# Patient Record
Sex: Male | Born: 1985
Health system: Southern US, Community
[De-identification: ages and names within clinical notes are randomized; demographics above are authoritative.]

## PROBLEM LIST (undated history)

## (undated) DIAGNOSIS — J45909 Unspecified asthma, uncomplicated: Secondary | ICD-10-CM

## (undated) DIAGNOSIS — J342 Deviated nasal septum: Secondary | ICD-10-CM

## (undated) DIAGNOSIS — J343 Hypertrophy of nasal turbinates: Secondary | ICD-10-CM

## (undated) DIAGNOSIS — Z87820 Personal history of traumatic brain injury: Secondary | ICD-10-CM

## (undated) HISTORY — DX: Unspecified asthma, uncomplicated: J45.909

## (undated) HISTORY — PX: NEVUS EXCISION: SHX2090

## (undated) HISTORY — PX: CYST EXCISION: SHX5701

---
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
## 2005-12-13 ENCOUNTER — Ambulatory Visit: Payer: Self-pay | Admitting: Family Medicine

## 2006-02-07 ENCOUNTER — Ambulatory Visit: Payer: Self-pay | Admitting: Family Medicine

## 2006-11-30 ENCOUNTER — Ambulatory Visit: Payer: Self-pay | Admitting: Family Medicine

## 2007-01-29 ENCOUNTER — Ambulatory Visit: Payer: Self-pay | Admitting: Family Medicine

## 2007-06-27 ENCOUNTER — Ambulatory Visit: Payer: Self-pay | Admitting: Family Medicine

## 2007-10-23 ENCOUNTER — Ambulatory Visit: Payer: Self-pay | Admitting: Family Medicine

## 2008-01-18 ENCOUNTER — Ambulatory Visit: Payer: Self-pay | Admitting: Family Medicine

## 2008-07-25 ENCOUNTER — Ambulatory Visit: Payer: Self-pay | Admitting: Family Medicine

## 2009-02-19 ENCOUNTER — Ambulatory Visit: Payer: Self-pay | Admitting: Family Medicine

## 2009-04-09 ENCOUNTER — Ambulatory Visit: Payer: Self-pay | Admitting: Family Medicine

## 2010-01-21 ENCOUNTER — Ambulatory Visit: Payer: Self-pay | Admitting: Family Medicine

## 2011-01-27 ENCOUNTER — Telehealth: Payer: Self-pay | Admitting: Family Medicine

## 2011-01-27 NOTE — Telephone Encounter (Signed)
Patient was notified that his medication was called in for 30 days but he will need an ov in order to receive anymore refills. CLS

## 2011-01-28 ENCOUNTER — Ambulatory Visit (INDEPENDENT_AMBULATORY_CARE_PROVIDER_SITE_OTHER): Payer: Managed Care, Other (non HMO) | Admitting: Family Medicine

## 2011-01-28 ENCOUNTER — Encounter: Payer: Self-pay | Admitting: Family Medicine

## 2011-01-28 ENCOUNTER — Other Ambulatory Visit (INDEPENDENT_AMBULATORY_CARE_PROVIDER_SITE_OTHER): Payer: Managed Care, Other (non HMO)

## 2011-01-28 VITALS — BP 130/88 | HR 88 | Wt 165.0 lb

## 2011-01-28 DIAGNOSIS — Z23 Encounter for immunization: Secondary | ICD-10-CM

## 2011-01-28 DIAGNOSIS — J301 Allergic rhinitis due to pollen: Secondary | ICD-10-CM

## 2011-01-28 DIAGNOSIS — J45909 Unspecified asthma, uncomplicated: Secondary | ICD-10-CM

## 2011-01-28 MED ORDER — ZAFIRLUKAST 20 MG PO TABS: 20.0000 mg | ORAL_TABLET | Freq: Two times a day (BID) | ORAL | Status: DC

## 2011-01-28 NOTE — Progress Notes (Signed)
  Subjective:    Patient ID: [name], male    DOB: [birth year], 25 y.o.   MRN: [id number]  HPI He is here for a recheck. He does have underlying allergies and is on medications listed in the chart. He is doing quite well on them. His asthma gives him very little difficulty. He is not taking Advair on a regular basis and does not need to.   Review of Systems     Objective:   Physical Exam alert and in no distress. Tympanic membranes and canals are normal. Throat is clear. Tonsils are normal. Neck is supple without adenopathy or thyromegaly. Cardiac exam shows a regular sinus rhythm without murmurs or gallops. Lungs are clear to auscultation.        Assessment & Plan:   1. Allergic rhinitis due to pollen   2. Asthma    discussed switching him to Singulair however we'll continue on Accolate. He is to get his own insurance in the near future and we might discuss switching him at that point.

## 2011-09-19 ENCOUNTER — Telehealth: Payer: Self-pay | Admitting: Family Medicine

## 2011-09-19 MED ORDER — FLUTICASONE PROPIONATE 50 MCG/ACT NA SUSP: 2.0000 | Freq: Every day | NASAL | Status: DC

## 2011-09-19 MED ORDER — FLUTICASONE-SALMETEROL 250-50 MCG/DOSE IN AEPB: 2.0000 | INHALATION_SPRAY | Freq: Two times a day (BID) | RESPIRATORY_TRACT | Status: DC

## 2011-09-19 NOTE — Telephone Encounter (Signed)
His medications were renewed for several months.

## 2011-09-29 ENCOUNTER — Emergency Department (HOSPITAL_COMMUNITY)
Admission: EM | Admit: 2011-09-29 | Discharge: 2011-09-30 | Disposition: A | Discharge status: Home / Self Care | Payer: BC Managed Care – PPO | Attending: Emergency Medicine | Admitting: Emergency Medicine

## 2011-09-29 ENCOUNTER — Emergency Department (HOSPITAL_COMMUNITY): Payer: BC Managed Care – PPO

## 2011-09-29 ENCOUNTER — Encounter (HOSPITAL_COMMUNITY): Payer: Self-pay | Admitting: Emergency Medicine

## 2011-09-29 DIAGNOSIS — IMO0002 Reserved for concepts with insufficient information to code with codable children: Secondary | ICD-10-CM | POA: Insufficient documentation

## 2011-09-29 DIAGNOSIS — W268XXA Contact with other sharp object(s), not elsewhere classified, initial encounter: Secondary | ICD-10-CM | POA: Insufficient documentation

## 2011-09-29 DIAGNOSIS — S61409A Unspecified open wound of unspecified hand, initial encounter: Secondary | ICD-10-CM | POA: Insufficient documentation

## 2011-09-29 DIAGNOSIS — Z87891 Personal history of nicotine dependence: Secondary | ICD-10-CM | POA: Insufficient documentation

## 2011-09-29 NOTE — ED Notes (Signed)
Alert no distress 

## 2011-09-29 NOTE — ED Notes (Signed)
PT. PUNCHED A REAR VIEW MIRROR OF A TRUCK THIS EVENING , PRESENTS WITH LACERATION AT RIGHT HAND KNUCKLE , PRESSURE DRESSING APPLIED AT TRIAGE , + ETOH.

## 2011-09-30 MED ORDER — OXYCODONE-ACETAMINOPHEN 5-325 MG PO TABS: ORAL_TABLET | ORAL | Status: AC

## 2011-09-30 MED ORDER — OXYCODONE-ACETAMINOPHEN 5-325 MG PO TABS
1.0000 | ORAL_TABLET | Freq: Once | ORAL | Status: AC
  Administered 2011-09-30: 1 via ORAL
  Filled 2011-09-30: qty 1

## 2011-09-30 NOTE — ED Provider Notes (Signed)
Medical screening examination/treatment/procedure(s) were performed by non-physician practitioner and as supervising physician I was immediately available for consultation/collaboration.  Ethelda Chick, MD 09/30/11 [phone]

## 2011-09-30 NOTE — ED Notes (Signed)
Snack tray given, patients wound cleaned and wrapped. Repeated Vitals

## 2011-09-30 NOTE — Discharge Instructions (Signed)
Follow up with your doctor, an urgent care, or this Emergency Department for removal of your stitches in 7 days. Do not submerge the stitches in water for the first 24 hours. Take your pain medication as prescribed. Do not operate heavy machinery while on pain medication. Note that your pain medication contains acetaminophen (Tylenol) & its is not reccommended that you use additional acetaminophen (Tylenol) while taking this medication. Read instructions below.  TREATMENT   Keep the wound clean and dry.   If you were given a bandage (dressing), you should change it at least once a day. Also, change the dressing if it becomes wet or dirty, or as directed by your caregiver.   Wash the wound with soap and water 2 times a day. Rinse the wound off with water to remove all soap. Pat the wound dry with a clean towel.   You may shower as usual after the first 24 hours. Do not soak the wound in water until the sutures are removed.   Once the wound has healed, scarring can be minimized by covering the wound with sunscreen during the day for 1 full year.Marland Kitchen   SEEK MEDICAL CARE IF:   You have redness, swelling, or increasing pain in the wound.   You see a red line that goes away from the wound.   You have yellowish-white fluid (pus) coming from the wound.   You have a fever.   You notice a bad smell coming from the wound or dressing.   Your wound breaks open before or after sutures have been removed.   You notice something coming out of the wound such as wood or glass.   Your wound is on your hand or foot and you cannot move a finger or toe.   Your pain is not controlled with prescribed medicine.   If you did not receive a tetanus shot today because you thought you were up to date, but did not recall when your last one was given, make sure to check with your primary caregiver to determine if you need one.

## 2011-09-30 NOTE — ED Provider Notes (Signed)
History     CSN: [id number]  Arrival date & time 09/29/11  [id number]   First MD Initiated Contact with Patient 09/30/11 0030      Chief Complaint  Patient presents with  . Laceration    (Consider location/radiation/quality/duration/timing/severity/associated sxs/prior treatment) HPI Comments: Patient presents emergency chief complaint of laceration on right knuckle.  Mechanism was punching mirror.  Patient denies any numbness, weakness or tingling of extremity.  Wound is currently bleeding.  Patient states his tetanus status is up to date and he received his last booster less than 5 years ago.  Laceration location is right hand knuckle dorsal surface of index finger.  No other complaints this time.  Patient is a 26 y.o. male presenting with skin laceration. The history is provided by the patient.  Laceration     History reviewed. No pertinent past medical history.  History reviewed. No pertinent past surgical history.  No family history on file.  History  Substance Use Topics  . Smoking status: Former Smoker    Quit date: 04/11/2006  . Smokeless tobacco: Not on file  . Alcohol Use: Yes      Review of Systems  Constitutional: Negative for fever, chills and appetite change.  HENT: Negative for congestion.   Eyes: Negative for visual disturbance.  Respiratory: Negative for shortness of breath.   Cardiovascular: Negative for chest pain and leg swelling.  Gastrointestinal: Negative for abdominal pain.  Genitourinary: Negative for dysuria, urgency and frequency.  Skin: Positive for wound.  Neurological: Negative for dizziness, syncope, weakness, light-headedness, numbness and headaches.  Psychiatric/Behavioral: Negative for confusion.  All other systems reviewed and are negative.    Allergies  Peanuts  Home Medications   Current Outpatient Rx  Name Route Sig Dispense Refill  . FLUTICASONE PROPIONATE 50 MCG/ACT NA SUSP Nasal Place 2 sprays into the nose daily. 16 g 3    . FLUTICASONE-SALMETEROL 250-50 MCG/DOSE IN AEPB Inhalation Inhale 2 puffs into the lungs every 12 (twelve) hours. 60 each 3  . ZAFIRLUKAST 20 MG PO TABS Oral Take 1 tablet (20 mg total) by mouth 2 (two) times daily. 180 tablet 3    BP 137/81  Pulse 110  Temp 97.6 F (36.4 C) (Oral)  Resp 16  SpO2 96%  Physical Exam  Nursing note and vitals reviewed. Constitutional: He is oriented to person, place, and time. He appears well-developed and well-nourished. No distress.  HENT:  Head: Normocephalic and atraumatic.  Eyes: Conjunctivae and EOM are normal.  Neck: Normal range of motion.  Cardiovascular:       Intact radial pulses  Pulmonary/Chest: Effort normal.  Musculoskeletal: Normal range of motion.       Flexion & extension of digits intatct  Neurological: He is alert and oriented to person, place, and time.       2 pt discrimination & sensation intact  Skin: Skin is warm and dry. No rash noted. He is not diaphoretic.       Superficial laceration over 2nd digit mcp dorsal surface, 3 cm round, currently bleeding.   Psychiatric: He has a normal mood and affect. His behavior is normal.    ED Course  Procedures (including critical care time)  Labs Reviewed - No data to display Dg Hand Complete Right  09/29/2011  *RADIOLOGY REPORT*  Clinical Data: Right hand pain and laceration secondary to blunt trauma.  RIGHT HAND - COMPLETE 3+ VIEW  Comparison: None.  Findings: There is no fracture or dislocation or other significant osseous abnormality.  No radiodense foreign bodies in the soft tissues.  Soft tissue swelling over the dorsum of the hand.  IMPRESSION: No acute osseous abnormalities.  Original Report Authenticated By: Gwynn Burly, M.D.   LACERATION REPAIR Performed by: Jaci Carrel Authorized by: Jaci Carrel Consent: Verbal consent obtained. Risks and benefits: risks, benefits and alternatives were discussed Consent given by: patient Patient identity confirmed: provided  demographic data Prepped and Draped in normal sterile fashion Wound explored  Laceration Location: right hand, dorsal surface over 2nd MCP  Laceration Length: 4 cm round  No Foreign Bodies seen or palpated  Anesthesia: local infiltration  Local anesthetic: lidocaine 2% with out  epinephrine  Anesthetic total: 2 ml  Irrigation method: syringe Amount of cleaning: standard  Skin closure: 4.0 prolene  Number of sutures: 5  Technique: simple interrupted   Patient tolerance: Patient tolerated the procedure well with no immediate complications.   No diagnosis found.    MDM  Tdap up to date per pt .Pressure irrigation performed. Laceration occurred < 8 hours prior to repair which was well tolerated. Pt has no co morbidities to effect normal wound healing. Discussed suture home care w pt and answered questions. Pt to f-u for wound check and suture removal in 7 days. Pt is hemodynamically stable w no complaints prior to dc.           Jaci Carrel, [place] 09/30/11 [phone]

## 2011-10-12 ENCOUNTER — Encounter: Payer: Self-pay | Admitting: Physician Assistant

## 2011-10-12 ENCOUNTER — Ambulatory Visit (INDEPENDENT_AMBULATORY_CARE_PROVIDER_SITE_OTHER): Payer: BC Managed Care – PPO | Admitting: Physician Assistant

## 2011-10-12 VITALS — BP 133/86 | HR 101 | Temp 98.1°F | Resp 16 | Ht 67.5 in | Wt 184.4 lb

## 2011-10-12 DIAGNOSIS — T8131XA Disruption of external operation (surgical) wound, not elsewhere classified, initial encounter: Secondary | ICD-10-CM

## 2011-10-12 DIAGNOSIS — T8130XA Disruption of wound, unspecified, initial encounter: Secondary | ICD-10-CM

## 2011-10-12 DIAGNOSIS — M79609 Pain in unspecified limb: Secondary | ICD-10-CM

## 2011-10-12 NOTE — Progress Notes (Signed)
  Subjective:    Patient ID: [name], male    DOB: 12-14-1985, 26 y.o.   MRN: [id number]  HPI here for suture removal s/p sutures placed in R hand 13 days ago.  No f/c, no erythema    Review of Systems  All other systems reviewed and are negative.       Objective:   Physical Exam  Nursing note and vitals reviewed. Constitutional: He appears well-developed and well-nourished.  HENT:  Head: Normocephalic and atraumatic.  Skin:       R hand-sutures removed over R index MCP. Patient has been applying neosporin and the skin is soft and appears at risk for dehiscence.   Steri-strips applied and splinted index and middle finger together in extension.  Bandaged and ACE wrapped.       Assessment & Plan:  Wound-at risk for dehiscence.  Keep in extension for next 3 days and gradually and gently increase ROM.  Wound care discussed.  NO more neosporin.

## 2011-12-10 ENCOUNTER — Other Ambulatory Visit: Payer: Self-pay | Admitting: Family Medicine

## 2011-12-13 NOTE — Telephone Encounter (Signed)
Don't let him run out but he needs a followup appointment

## 2011-12-13 NOTE — Telephone Encounter (Signed)
IS THIS OK 

## 2012-02-15 ENCOUNTER — Other Ambulatory Visit: Payer: Self-pay | Admitting: Family Medicine

## 2012-03-31 ENCOUNTER — Ambulatory Visit (INDEPENDENT_AMBULATORY_CARE_PROVIDER_SITE_OTHER): Payer: BC Managed Care – PPO | Admitting: Family Medicine

## 2012-03-31 VITALS — BP 130/74 | HR 80 | Temp 98.1°F | Resp 16 | Ht 67.0 in | Wt 168.0 lb

## 2012-03-31 DIAGNOSIS — R05 Cough: Secondary | ICD-10-CM

## 2012-03-31 DIAGNOSIS — J069 Acute upper respiratory infection, unspecified: Secondary | ICD-10-CM

## 2012-03-31 DIAGNOSIS — R059 Cough, unspecified: Secondary | ICD-10-CM

## 2012-03-31 DIAGNOSIS — R0989 Other specified symptoms and signs involving the circulatory and respiratory systems: Secondary | ICD-10-CM

## 2012-03-31 DIAGNOSIS — J310 Chronic rhinitis: Secondary | ICD-10-CM

## 2012-03-31 MED ORDER — BENZONATATE 100 MG PO CAPS: 100.0000 mg | ORAL_CAPSULE | Freq: Three times a day (TID) | ORAL | Status: DC | PRN

## 2012-03-31 MED ORDER — IPRATROPIUM BROMIDE 0.02 % IN SOLN: 500.0000 ug | Freq: Four times a day (QID) | RESPIRATORY_TRACT | Status: DC

## 2012-03-31 NOTE — Progress Notes (Signed)
  [facility]:  Office Visit  Chief Complaint:  Chief Complaint  Patient presents with  . Nasal Congestion    x1day feels fatigue    HPI: [name] is a 26 y.o. male who complains of : 1 day h/o nasal congestion, clear to yellow rhinorrhea, dry cough. Has asthma, but has not used an inhaler x many years.  Denies seasonal allergies. Tried tea and honey without releif.  No past medical history on file. No past surgical history on file. History   Social History  . Marital Status: Single    Spouse Name: N/A    Number of Children: N/A  . Years of Education: N/A   Social History Main Topics  . Smoking status: Never Smoker   . Smokeless tobacco: Never Used  . Alcohol Use: Yes  . Drug Use: No  . Sexually Active: None   Other Topics Concern  . None   Social History Narrative  . None   No family history on file. Allergies  Allergen Reactions  . Peanuts (Peanut Oil) Shortness Of Breath   Prior to Admission medications   Medication Sig Start Date End Date Taking? Authorizing Provider  ADVAIR DISKUS 250-50 MCG/DOSE AEPB INHALE 2 PUFFSEVERY 12 HOURS AS DIRECTED 02/15/12  Yes Ronnald Nian, MD  fluticasone [facility]) 50 MCG/ACT nasal spray Place 2 sprays into the nose daily. 09/19/11   Ronnald Nian, MD  zafirlukast (ACCOLATE) 20 MG tablet TAKE 1 TABLET (20 MG TOTAL) BY MOUTH 2 (TWO) TIMES DAILY. 12/10/11   Ronnald Nian, MD     ROS: The patient denies fevers, chills, night sweats, unintentional weight loss, chest pain, palpitations, wheezing, dyspnea on exertion, nausea, vomiting, abdominal pain, dysuria, hematuria, melena, numbness, weakness, or tingling.  All other systems have been reviewed and were otherwise negative with the exception of those mentioned in the HPI and as above.    PHYSICAL EXAM: Filed Vitals:   03/31/12 1146  BP: 130/74  Pulse: 80  Temp: 98.1 F (36.7 C)  Resp: 16   Filed Vitals:   03/31/12 1146  Height: 5\' 7"  (1.702 m)   Weight: 168 lb (76.204 kg)   Body mass index is 26.31 kg/(m^2).  General: Alert, no acute distress HEENT:  Normocephalic, atraumatic, oropharynx patent. TM nl, EOMI, PERRLA,  no exudates, minimal to no sinus tenderness Cardiovascular:  Regular rate and rhythm, no rubs murmurs or gallops.  No Carotid bruits, radial pulse intact. No pedal edema.  Respiratory: Clear to auscultation bilaterally.  No wheezes, rales, or rhonchi.  No cyanosis, no use of accessory musculature GI: No organomegaly, abdomen is soft and non-tender, positive bowel sounds.  No masses. Skin: No rashes. Neurologic: Facial musculature symmetric. Psychiatric: Patient is appropriate throughout our interaction. Lymphatic: No cervical lymphadenopathy Musculoskeletal: Gait intact.   LABS: No results found for this or any previous visit.   EKG/XRAY:   Primary read interpreted by Dr. Conley Rolls at [facility].   ASSESSMENT/PLAN: Encounter Diagnoses  Name Primary?  . Rhinitis, non-allergic Yes  . Cough   . Viral URI    Rx Atrovent nasal spray, tessalon perles Gave written rx fro Amoxacillin to be filled in 4-5 days if no improvement with symptomatic treatment F/u prn    LE, THAO PHUONG, DO 04/04/2012 5:58 AM

## 2012-04-22 ENCOUNTER — Other Ambulatory Visit: Payer: Self-pay | Admitting: Family Medicine

## 2012-05-01 ENCOUNTER — Other Ambulatory Visit: Payer: Self-pay | Admitting: Family Medicine

## 2012-05-04 ENCOUNTER — Encounter: Payer: Self-pay | Admitting: Family Medicine

## 2012-05-04 ENCOUNTER — Ambulatory Visit (INDEPENDENT_AMBULATORY_CARE_PROVIDER_SITE_OTHER): Payer: BC Managed Care – PPO | Admitting: Family Medicine

## 2012-05-04 VITALS — BP 130/90 | HR 89 | Wt 174.0 lb

## 2012-05-04 DIAGNOSIS — J45909 Unspecified asthma, uncomplicated: Secondary | ICD-10-CM

## 2012-05-04 DIAGNOSIS — J301 Allergic rhinitis due to pollen: Secondary | ICD-10-CM

## 2012-05-04 MED ORDER — MONTELUKAST SODIUM 10 MG PO TABS: 10.0000 mg | ORAL_TABLET | Freq: Every day | ORAL | Status: DC

## 2012-05-04 MED ORDER — FLUTICASONE-SALMETEROL 250-50 MCG/DOSE IN AEPB: 1.0000 | INHALATION_SPRAY | Freq: Two times a day (BID) | RESPIRATORY_TRACT | Status: DC

## 2012-05-04 MED ORDER — FLUTICASONE PROPIONATE 50 MCG/ACT NA SUSP: 2.0000 | Freq: Every day | NASAL | Status: DC

## 2012-05-04 NOTE — Progress Notes (Signed)
  Subjective:    Patient ID: [name], male    DOB: 04/15/1985, 27 y.o.   MRN: [id number]  HPI He comes in for medication check. He does have underlying allergies and asthma. Presently he is using Accolate however he did run out of his Advair. He is having difficulty with wheezing and chest congestion as well as nasal congestion ear fullness, rhinorrhea. His work is going well. He spends a lot of time outside. He does not smoke.   Review of Systems     Objective:   Physical Exam alert and in no distress. Tympanic membranes and canals are normal. Throat is clear. Tonsils are normal. Neck is supple without adenopathy or thyromegaly. Cardiac exam shows a regular sinus rhythm without murmurs or gallops. Lungs are clear to auscultation.        Assessment & Plan:   1. Allergic rhinitis due to pollen  montelukast (SINGULAIR) 10 MG tablet, fluticasone (FLONASE) 50 MCG/ACT nasal spray  2. Asthma  montelukast (SINGULAIR) 10 MG tablet, Fluticasone-Salmeterol (ADVAIR DISKUS) 250-50 MCG/DOSE AEPB   recommended switching to Singulair which he would like to try. Also recommend he get back on Flonase to help with his allergy symptoms. Continue on Advair. He does occasionally use albuterol. He will call when this runs out.

## 2012-12-17 ENCOUNTER — Ambulatory Visit (INDEPENDENT_AMBULATORY_CARE_PROVIDER_SITE_OTHER): Payer: BC Managed Care – PPO | Admitting: Family Medicine

## 2012-12-17 ENCOUNTER — Encounter: Payer: Self-pay | Admitting: Family Medicine

## 2012-12-17 VITALS — BP 122/72 | HR 88 | Temp 98.5°F | Ht 68.0 in | Wt 170.0 lb

## 2012-12-17 DIAGNOSIS — J309 Allergic rhinitis, unspecified: Secondary | ICD-10-CM

## 2012-12-17 DIAGNOSIS — K5289 Other specified noninfective gastroenteritis and colitis: Secondary | ICD-10-CM

## 2012-12-17 DIAGNOSIS — K529 Noninfective gastroenteritis and colitis, unspecified: Secondary | ICD-10-CM

## 2012-12-17 DIAGNOSIS — R51 Headache: Secondary | ICD-10-CM

## 2012-12-17 NOTE — Progress Notes (Signed)
Chief Complaint  Patient presents with  . Generalized Body Aches    started Sat night with cold sweats, abdominal pain, legs aching, HA and going between chills and feeling hot. Feeling a little better today, some stomach cramping but much less severe. Diarrhea since Saturday that has not gotten any better. Fever last night and today of 100.6.    2 nights ago started with chills, sweats, along with body aches and then started with stomach cramps and diarrhea.  He was having diarrhea every 30 minutes, up most of Saturday night.  He had frequent episodes of very small amounts of loose stool, about 1-2x/hour.  Slowed down some last night, and improved today.  He started taking Pepto Bismol yesterday 4pm, last dose was early this morning.  No associated nausea or vomiting.  Denies any known sick contacts.  No recent travel or camping, no antibiotics, no undercooked/raw/spoiled foods.  Denies any bloody stools, but notes that the stools turned black after using Pepto Bismol.  He has been having headaches--scalp feels sensitive and achey, and also currently has headache above left eye.  Having some nasal stuffiness, but no sinus pain, sore throat, drainage, cough.  Today, the body aches are improved, just mild in lower back.  Has some waves of abdominal pain (not related to having bowel movements), less severe. He had chicken noodle soup today.  Past Medical History  Diagnosis Date  . Allergy   . Asthma    History reviewed. No pertinent past surgical history. History   Social History  . Marital Status: Single    Spouse Name: N/A    Number of Children: N/A  . Years of Education: N/A   Occupational History  . [employer]   Social History Main Topics  . Smoking status: Never Smoker   . Smokeless tobacco: Never Used  . Alcohol Use: Yes     Comment: 2 beers per week.   . Drug Use: No  . Sexual Activity: Not on file   Other Topics Concern  . Not on file   Social History Narrative   . No narrative on file   Current Outpatient Prescriptions on File Prior to Visit  Medication Sig Dispense Refill  . Fluticasone-Salmeterol (ADVAIR DISKUS) 250-50 MCG/DOSE AEPB Inhale 1 puff into the lungs 2 (two) times daily.  180 each  3  . montelukast (SINGULAIR) 10 MG tablet Take 1 tablet (10 mg total) by mouth at bedtime.  90 tablet  3  . fluticasone (FLONASE) 50 MCG/ACT nasal spray Place 2 sprays into the nose daily.  48 g  3  . ipratropium (ATROVENT) 0.02 % nebulizer solution Take 2.5 mLs (500 mcg total) by nebulization 4 (four) times daily.  75 mL  0   No current facility-administered medications on file prior to visit.   Allergies  Allergen Reactions  . Peanuts [Peanut Oil] Shortness Of Breath   ROS:  Denies nausea,vomiting, urinary frequency or dysuria.  Denies bleeding/bruising, rash.  Some mild nasal stuffiness, no sore throat, cough, shortness of breath. No chest pain, palpitations. +low grade fever, myalgias, headaches, abdominal pain and diarrhea per HPI.  PHYSICAL EXAM: BP 122/72  Pulse 88  Temp(Src) 98.5 F (36.9 C) (Oral)  Ht 5\' 8"  (1.727 m)  Wt 170 lb (77.111 kg)  BMI 25.85 kg/m2 Pleasant, well-appearing male in no distress HEENT:  PERRL, EOMI, conjunctiva clear. Nasal mucosa mildly edematous with clear mucus.  Sinuses nontender.  OP clear, mucus membranes moist. Neck: no lymphadenopathy, thyromegaly or  mass Heart: regular rate and rhythm without murmur Lungs: clear bilaterally, no wheezes, rales, ronchi Abdomen: soft.  Active bowel sounds (mildly hyperactive).  No rebound tenderness or guarding.  Very mild tenderness in epigastrium and diffusely across lower abdomen.  No masses. Skin: no rash Psych: normal mood, affect Neuro: alert and oriented  ASSESSMENT/PLAN:  Gastroenteritis, acute - suspect viral.  improved today.  supportive measures reviewed, as well as symptoms of alarm  Headache(784.0) - likely related to viral illness, possible sinus component,  but no evidence of bacterial infection  Allergic rhinitis, cause unspecified  AGE--likely viral in origin.  Symptomatic/supportive measures. Drink plenty of fluids. Avoid dairy for about a week. If diarrhea isn't well controlled with Pepto Bismol, you can change to Imodium.  Allergies--use OTC antihistamines in addition to current med, as needed  F/u prn

## 2012-12-17 NOTE — Patient Instructions (Addendum)
Diarrhea:  likely viral in origin.  Symptomatic/supportive measures. Drink plenty of fluids. Avoid dairy for about a week. If diarrhea isn't well controlled with Pepto Bismol, you can change to Imodium.  B.R.A.T. Diet Your doctor has recommended the B.R.A.T. diet for you or your child until the condition improves. This is often used to help control diarrhea and vomiting symptoms. If you or your child can tolerate clear liquids, you may have:  Bananas.   Rice.   Applesauce.   Toast (and other simple starches such as crackers, potatoes, noodles).  Be sure to avoid dairy products, meats, and fatty foods until symptoms are better. Fruit juices such as apple, grape, and prune juice can make diarrhea worse. Avoid these. Continue this diet for 2 days or as instructed by your caregiver. Document Released: 03/28/2005 Document Revised: 03/17/2011 Document Reviewed: 09/14/2006 [facility] Patient Information 2012 [place], [place].   Return if persistent/worsening of fevers, abdominal pain, any blood in stool, or other concerns.

## 2013-02-11 ENCOUNTER — Encounter: Payer: Self-pay | Admitting: Internal Medicine

## 2013-05-25 ENCOUNTER — Other Ambulatory Visit: Payer: Self-pay | Admitting: Family Medicine

## 2013-07-10 ENCOUNTER — Other Ambulatory Visit: Payer: Self-pay | Admitting: Family Medicine

## 2013-07-10 NOTE — Telephone Encounter (Signed)
Have him set up a followup appointment concerning his asthma and allergies

## 2013-07-10 NOTE — Telephone Encounter (Signed)
Pt states he will CB to schedule appt

## 2013-07-10 NOTE — Telephone Encounter (Signed)
Is this ok to refill?  

## 2013-12-23 ENCOUNTER — Ambulatory Visit (INDEPENDENT_AMBULATORY_CARE_PROVIDER_SITE_OTHER): Payer: 59 | Admitting: Family Medicine

## 2013-12-23 ENCOUNTER — Encounter: Payer: Self-pay | Admitting: Family Medicine

## 2013-12-23 VITALS — BP 126/80 | HR 93 | Wt 178.0 lb

## 2013-12-23 DIAGNOSIS — J4531 Mild persistent asthma with (acute) exacerbation: Secondary | ICD-10-CM

## 2013-12-23 DIAGNOSIS — J301 Allergic rhinitis due to pollen: Secondary | ICD-10-CM

## 2013-12-23 DIAGNOSIS — J45901 Unspecified asthma with (acute) exacerbation: Secondary | ICD-10-CM

## 2013-12-23 DIAGNOSIS — J0100 Acute maxillary sinusitis, unspecified: Secondary | ICD-10-CM

## 2013-12-23 MED ORDER — MONTELUKAST SODIUM 10 MG PO TABS
10.0000 mg | ORAL_TABLET | Freq: Every day | ORAL | Status: DC
Start: 2013-12-23 — End: 2014-04-01

## 2013-12-23 MED ORDER — AMOXICILLIN 875 MG PO TABS
875.0000 mg | ORAL_TABLET | Freq: Two times a day (BID) | ORAL | Status: DC
Start: 2013-12-23 — End: 2014-04-01

## 2013-12-23 MED ORDER — FLUTICASONE-SALMETEROL 250-50 MCG/DOSE IN AEPB: INHALATION_SPRAY | RESPIRATORY_TRACT | Status: DC

## 2013-12-24 ENCOUNTER — Encounter: Payer: Self-pay | Admitting: Family Medicine

## 2013-12-24 NOTE — Progress Notes (Signed)
   Subjective:    Patient ID: [name], male    DOB: [birth year], 28 y.o.   MRN: [id number]  HPI He is here for evaluation of a two-week history of sinus congestion, PND but no rhinorrhea or sore throat,. He then developed a dry cough became productive with wheezing and worsening of his other symptoms. He has no fever, headaches, chills or sore throat. He does have an underlying history of allergies and asthma. He does need refills on his medications. He has tried OTC medications to help with the symptoms. He does not smoke   Review of Systems     Objective:   Physical Exam alert and in no distress. Tympanic membranes and canals are normal. Throat is clear. Tonsils are normal. Neck is supple without adenopathy or thyromegaly. Cardiac exam shows a regular sinus rhythm without murmurs or gallops. Lungs show slight expiratory wheezing. Nasal mucosa was slightly red with maxillary sinus tenderness.        Assessment & Plan:  Asthma with acute exacerbation, mild persistent - Plan: Fluticasone-Salmeterol (ADVAIR DISKUS) 250-50 MCG/DOSE AEPB  Allergic rhinitis due to pollen - Plan: montelukast (SINGULAIR) 10 MG tablet  Acute maxillary sinusitis, recurrence not specified - Plan: amoxicillin (AMOXIL) 875 MG tablet  he is to call me if not entirely better when he finishes the course of the antibiotic.

## 2014-04-01 ENCOUNTER — Ambulatory Visit (INDEPENDENT_AMBULATORY_CARE_PROVIDER_SITE_OTHER): Payer: 59

## 2014-04-01 ENCOUNTER — Ambulatory Visit (INDEPENDENT_AMBULATORY_CARE_PROVIDER_SITE_OTHER): Payer: 59 | Admitting: Family Medicine

## 2014-04-01 VITALS — BP 130/92 | HR 105 | Temp 98.0°F | Resp 16 | Ht 67.0 in | Wt 183.0 lb

## 2014-04-01 DIAGNOSIS — S99921A Unspecified injury of right foot, initial encounter: Secondary | ICD-10-CM

## 2014-04-01 DIAGNOSIS — M79674 Pain in right toe(s): Secondary | ICD-10-CM

## 2014-04-01 NOTE — Progress Notes (Signed)
Subjective:    Patient ID: [name], male    DOB: 30-Apr-1985, 28 y.o.   MRN: [id number]  This chart was scribed for Merri Ray, MD by Stephania Fragmin, ED Scribe. This patient was seen in room 5 and the patient's care was started at 8:51 PM.   HPI   HPI Comments: [name] is a 28 y.o. male who presents to the [facility] complaining of toe pain and bruising that began 4 days ago when patient suspects he may have dropped something on it, although he doesn't remember having dropped anything on it. Patient has a history of 2 ingrown toenails requiring surgical removal; he states that this feels different from an ingrown nail. Patient runs 21 miles a week, works outside, and wears steel-toed shoes at work. He denies any changes in running shoes or running mileage. He denies discharge.   Patient Active Problem List   Diagnosis Date Noted  . Allergic rhinitis due to pollen 01/28/2011   Past Medical History  Diagnosis Date  . Allergy   . Asthma    No past surgical history on file. Allergies  Allergen Reactions  . Peanuts [Peanut Oil] Shortness Of Breath   Prior to Admission medications   Medication Sig Start Date End Date Taking? Authorizing Provider  Fluticasone-Salmeterol (ADVAIR DISKUS) 250-50 MCG/DOSE AEPB INHALE 1 PUFF INTO THE LUNGS 2 (TWO) TIMES DAILY. 12/23/13   [name], MD   History   Social History  . Marital Status: Single    Spouse Name: N/A    Number of Children: N/A  . Years of Education: N/A   Occupational History  . [place] History Main Topics  . Smoking status: Never Smoker   . Smokeless tobacco: Never Used  . Alcohol Use: Yes     Comment: 2 beers per week.   . Drug Use: No  . Sexual Activity: Yes   Other Topics Concern  . Not on file   Social History Narrative    Review of Systems  Skin: Positive for color change.          Objective:   Physical Exam  Constitutional: He is oriented  to person, place, and time. He appears well-developed and well-nourished. No distress.  HENT:  Head: Normocephalic and atraumatic.  Eyes: Conjunctivae and EOM are normal.  Neck: Neck supple. No tracheal deviation present.  Cardiovascular: Normal rate.   Pulmonary/Chest: Effort normal. No respiratory distress.  Musculoskeletal: Normal range of motion.  Neurological: He is alert and oriented to person, place, and time.  Skin: Skin is warm and dry. No rash noted.  A little bit of splitting at distal medial third of the great toenail, with thickening, and a wedge-shaped discoloration.  Proximal to that split area of nail, that proceeds back toward the nail base, some tenderness with squeezing of the distal toe.  Lateral nail folds and proximal nail folds non-tender. No pain with flexion of the IP of the great toe.  No other rash.  Psychiatric: He has a normal mood and affect. His behavior is normal.  Nursing note and vitals reviewed.   Filed Vitals:   04/01/14 2043  BP: 130/92  Pulse: 105  Temp: 98 F (36.7 C)  Resp: 16  Height: 5\' 7"  (1.702 m)  Weight: 183 lb (83.008 kg)  SpO2: 95%  [facility] reading (PRIMARY) by  Dr. Carlota Raspberry: R great toe - rounded area of sclerosis/necrosis of medial aspect of distal  phalynx.        Assessment & Plan:  [name] is a 28 y.o. male Toe injury, right, initial encounter - Plan: DG Toe Great Right  Great toe pain, right Initial hx suggests traumatic onycholysis, with running, but NKI.  Questionable area of necrosis on medial/distal aspect of distal phalynx. Pain improved today. Will have XR sent for overread, sx care and avoid repetitive pressure to nail/running. May need further imaging to affected area depending on XR report. RTC if any increased pain, discharge or nail fold involvement.     No orders of the defined types were placed in this encounter.   Patient Instructions  I suspect you may have had some trauma to the toenail and now some  lifting in the affected part of that nail.  I do not see any infection around the nail or discharge from the end, but if you do notice these symptoms - return for recheck. There is some irregular appearance to the inside/end of the large toe bone, but will have the radiologist read this xray as well. If other imaging needed - I will let you know. For now, decrease running and other repetitive pressure to affected nail. If the soreness is not continuing to improve in next week, or any worsening sooner - return to recheck this area. Tylenol or advil if needed.       I personally performed the services described in this documentation, which was scribed in my presence. The recorded information has been reviewed and considered, and addended by me as needed.

## 2014-04-01 NOTE — Patient Instructions (Addendum)
I suspect you may have had some trauma to the toenail and now some lifting in the affected part of that nail.  I do not see any infection around the nail or discharge from the end, but if you do notice these symptoms - return for recheck. There is some irregular appearance to the inside/end of the large toe bone, but will have the radiologist read this xray as well. If other imaging needed - I will let you know. For now, decrease running and other repetitive pressure to affected nail. If the soreness is not continuing to improve in next week, or any worsening sooner - return to recheck this area. Tylenol or advil if needed.

## 2014-04-03 ENCOUNTER — Telehealth: Payer: Self-pay | Admitting: *Deleted

## 2014-04-03 ENCOUNTER — Other Ambulatory Visit: Payer: Self-pay | Admitting: Family Medicine

## 2014-04-03 DIAGNOSIS — D1800 Hemangioma unspecified site: Secondary | ICD-10-CM

## 2014-04-03 NOTE — Telephone Encounter (Signed)
-----   Message from Wendie Agreste, MD sent at 04/03/2014 10:47 AM EST ----- I am trying to order an MRI of [place];s right foot but did not see this as option.  Can you assist in ordering this - first available for:   7 mm punched out erosion great toe. Rule out glomus tumor vs. Cyst.

## 2014-04-03 NOTE — Telephone Encounter (Signed)
Order has been placed.  [facility] closed this afternoon.  Did you want this as a STAT order?

## 2014-04-06 NOTE — Telephone Encounter (Signed)
Does not need to be stat order.  Can happen in next week to 10 days.  I told him it would be after Christmas.

## 2014-04-07 ENCOUNTER — Ambulatory Visit (INDEPENDENT_AMBULATORY_CARE_PROVIDER_SITE_OTHER): Payer: 59 | Admitting: Family Medicine

## 2014-04-07 ENCOUNTER — Encounter: Payer: Self-pay | Admitting: Family Medicine

## 2014-04-07 VITALS — BP 160/100 | HR 80 | Wt 187.0 lb

## 2014-04-07 DIAGNOSIS — D1809 Hemangioma of other sites: Secondary | ICD-10-CM

## 2014-04-07 DIAGNOSIS — D1800 Hemangioma unspecified site: Secondary | ICD-10-CM

## 2014-04-07 NOTE — Progress Notes (Signed)
   Subjective:    Patient ID: [name], male    DOB: [birth year], 28 y.o.   MRN: [id number]  HPI He is here for consult concerning continued difficulty with right great toe pain. He notes a change in his nail several months ago followed by some slight discoloration and recently the onset of pain. There is no history of trauma. He does run but the last time he ran was approximately 3 weeks ago.   Review of Systems     Objective:   Physical Exam exam of the right great toe does show some central medial ridging. The base of the nail is now loose with some slight fluctuance. At the tip of the nail there was a fluid-filled pocket. An  #11 blade was used and serous material was expressed from it. The x-ray was reviewed and does indicate possibility of a glomus tumor    Assessment & Plan:  Glomus tumor - Plan: Ambulatory referral to Orthopedic Surgery  since he continues to have difficulty with this and especially with a nail changes anything further evaluation is warranted.

## 2014-04-29 ENCOUNTER — Other Ambulatory Visit: Payer: Self-pay | Admitting: Orthopedic Surgery

## 2014-12-20 ENCOUNTER — Other Ambulatory Visit: Payer: Self-pay | Admitting: Family Medicine

## 2015-01-05 ENCOUNTER — Other Ambulatory Visit: Payer: Self-pay | Admitting: Family Medicine

## 2015-01-05 NOTE — Telephone Encounter (Signed)
Is this okay?

## 2015-01-16 ENCOUNTER — Ambulatory Visit (INDEPENDENT_AMBULATORY_CARE_PROVIDER_SITE_OTHER): Payer: 59 | Admitting: Family Medicine

## 2015-01-16 VITALS — BP 140/96 | HR 102 | Temp 98.4°F | Resp 18 | Ht 67.0 in | Wt 195.4 lb

## 2015-01-16 DIAGNOSIS — J329 Chronic sinusitis, unspecified: Secondary | ICD-10-CM | POA: Diagnosis not present

## 2015-01-16 DIAGNOSIS — J310 Chronic rhinitis: Secondary | ICD-10-CM

## 2015-01-16 DIAGNOSIS — H73012 Bullous myringitis, left ear: Secondary | ICD-10-CM | POA: Diagnosis not present

## 2015-01-16 MED ORDER — AMOXICILLIN-POT CLAVULANATE 875-125 MG PO TABS: 1.0000 | ORAL_TABLET | Freq: Two times a day (BID) | ORAL | Status: DC

## 2015-01-16 MED ORDER — FLUTICASONE PROPIONATE 50 MCG/ACT NA SUSP: 2.0000 | Freq: Every day | NASAL | Status: DC

## 2015-01-16 MED ORDER — HYDROCODONE-ACETAMINOPHEN 5-325 MG PO TABS: 1.0000 | ORAL_TABLET | ORAL | Status: DC | PRN

## 2015-01-16 NOTE — Patient Instructions (Signed)
Use fluticasone nose spray 2 sprays each nostril twice daily for 4 days, then once daily  Take the hydrocodone pain pills as needed for ear pain  In addition to that you can take ibuprofen 800 mg maximum of 3 times daily for additional pain relief  Take the Augmentin (amoxicillin/clavulanate) 875 mg twice daily for infection  Return if not improving or if worse at anytime

## 2015-01-16 NOTE — Progress Notes (Signed)
Patient ID: [name], male    DOB: April 27, 1985  Age: 29 y.o. MRN: [id number]  Chief Complaint  Patient presents with  . sinus pressure  . ear pressure    drainage, and pressure in left ear    Subjective:   Patient is here complaining of a bad left ear pain for last couple of days. He couldn't rest last night. He has had some sinus congestion for a few days in the left side. He is coughing a little bit. Has a history of asthma but has not been flaring up. He has not smoked. Has not had any documented fever. He works for [employer] for the [employer].  Objective: Right TM is normal. Left TM is still looking with a large bullous lesion on the drum. It is quite red. His neck is supple without significant nodes. Throat clear. Sinuses mildly tender on the left maxillary area. He is congested. Chest clear. Heart regular without murmurs.  Current allergies, medications, problem list, past/family and social histories reviewed.  Objective:  BP 140/96 mmHg  Pulse 102  Temp(Src) 98.4 F (36.9 C) (Oral)  Resp 18  Ht 5\' 7"  (1.702 m)  Wt 195 lb 6.4 oz (88.633 kg)  BMI 30.60 kg/m2  SpO2 98%  Left bullous otitis Rhinosinusitis  Assessment & Plan:   Assessment: 1. Bullous myringitis of left ear   2. Rhinosinusitis       Plan: Treat with antibiotics and decongested.       Patient Instructions  Use fluticasone nose spray 2 sprays each nostril twice daily for 4 days, then once daily  Take the hydrocodone pain pills as needed for ear pain  In addition to that you can take ibuprofen 800 mg maximum of 3 times daily for additional pain relief  Take the Augmentin (amoxicillin/clavulanate) 875 mg twice daily for infection  Return if not improving or if worse at anytime     No Follow-up on file.   Janette Harvie, MD 01/16/2015

## 2015-01-21 ENCOUNTER — Ambulatory Visit (INDEPENDENT_AMBULATORY_CARE_PROVIDER_SITE_OTHER): Payer: 59 | Admitting: Physician Assistant

## 2015-01-21 VITALS — BP 130/90 | HR 101 | Temp 98.4°F | Resp 18 | Ht 67.25 in | Wt 192.5 lb

## 2015-01-21 DIAGNOSIS — Z7251 High risk heterosexual behavior: Secondary | ICD-10-CM | POA: Diagnosis not present

## 2015-01-21 DIAGNOSIS — R309 Painful micturition, unspecified: Secondary | ICD-10-CM

## 2015-01-21 DIAGNOSIS — H6503 Acute serous otitis media, bilateral: Secondary | ICD-10-CM | POA: Diagnosis not present

## 2015-01-21 LAB — POCT URINALYSIS DIP (MANUAL ENTRY)
BILIRUBIN UA: NEGATIVE
BILIRUBIN UA: NEGATIVE
GLUCOSE UA: NEGATIVE
Leukocytes, UA: NEGATIVE
Nitrite, UA: NEGATIVE
Protein Ur, POC: NEGATIVE
RBC UA: NEGATIVE
SPEC GRAV UA: 1.015
UROBILINOGEN UA: 0.2
pH, UA: 6

## 2015-01-21 LAB — POC MICROSCOPIC URINALYSIS (UMFC): MUCUS RE: ABSENT

## 2015-01-21 NOTE — Patient Instructions (Addendum)
Start Sudafed over the counter for 2-3 days. If no change in discomfort should call back and will refer to ENT.

## 2015-01-21 NOTE — Progress Notes (Signed)
Subjective:    Patient ID: [name], male    DOB: 07/26/1985, 29 y.o.   MRN: [id number]  HPI Patient presents for continue ear pain and painful urination.  Was evaluated 5 days ago for bullous myringitis and treated with Augmentin, Flonase, and Norco. States that ear pain now is intermittent and Norco does not help much. Denies fever, congestion, cough, sinus pressure. Has not noticed any drainage from ears. Sounds still sound muffled.   Painful urination started this morning. Additionally endorses frequency. Denies hematuria, bladder pressure, urgency, penile discharge/swelling, N/V, or flank pain. Has increased water intake since he has been ill. No currently sexually active. Used condoms with vaginal intercourse, but not oral. Has not had recent STI screening.   NKDA.  Review of Systems As noted above.    Objective:   Physical Exam  Constitutional: He is oriented to person, place, and time. He appears well-developed and well-nourished. No distress.  Blood pressure 130/90, pulse 101, temperature 98.4 F (36.9 C), temperature source Oral, resp. rate 18, height 5' 7.25" (1.708 m), weight 192 lb 8 oz (87.317 kg), SpO2 98 %.   HENT:  Head: Normocephalic and atraumatic.  Right Ear: External ear and ear canal normal. No drainage, swelling or tenderness. No mastoid tenderness. Tympanic membrane is bulging. Tympanic membrane is not injected, not perforated, not erythematous and not retracted. A middle ear effusion (amber colored fluid) is present. No hemotympanum. Decreased hearing is noted.  Left Ear: External ear normal. No drainage, swelling or tenderness. No mastoid tenderness. Tympanic membrane is bulging. Tympanic membrane is not injected, not perforated, not erythematous and not retracted. A middle ear effusion (amber colored fluid) is present. Decreased hearing is noted.  Ears:  Nose: Rhinorrhea (with erythema) present. Right sinus exhibits maxillary sinus tenderness. Right  sinus exhibits no frontal sinus tenderness. Left sinus exhibits maxillary sinus tenderness. Left sinus exhibits no frontal sinus tenderness.  Mouth/Throat: Uvula is midline, oropharynx is clear and moist and mucous membranes are normal. No oropharyngeal exudate.  Eyes: Conjunctivae are normal. Pupils are equal, round, and reactive to light. Right eye exhibits no discharge. Left eye exhibits no discharge. No scleral icterus.  Neck: Normal range of motion. Neck supple. No thyromegaly present.  Cardiovascular: Normal rate, regular rhythm and normal heart sounds.  Exam reveals no gallop and no friction rub.   No murmur heard. Pulmonary/Chest: Effort normal and breath sounds normal. No respiratory distress. He has no decreased breath sounds. He has no wheezes. He has no rhonchi. He has no rales.  Abdominal: Soft. Bowel sounds are normal. He exhibits no distension. There is no tenderness. There is no rebound and no guarding.  Lymphadenopathy:    He has no cervical adenopathy.  Neurological: He is alert and oriented to person, place, and time.  Skin: Skin is warm and dry. No rash noted. He is not diaphoretic. No erythema.  Psychiatric: He has a normal mood and affect. His behavior is normal. Judgment and thought content normal.   Results for orders placed or performed in visit on 01/21/15  POCT urinalysis dipstick  Result Value Ref Range   Color, UA yellow yellow   Clarity, UA clear clear   Glucose, UA negative negative   Bilirubin, UA negative negative   Ketones, POC UA negative negative   Spec Grav, UA 1.015    Blood, UA negative negative   pH, UA 6.0    Protein Ur, POC negative negative   Urobilinogen, UA 0.2    Nitrite,  UA Negative Negative   Leukocytes, UA Negative Negative  POCT Microscopic Urinalysis ([facility])  Result Value Ref Range   WBC,UR,HPF,POC Few (A) None WBC/hpf   RBC,UR,HPF,POC Few (A) None RBC/hpf   Bacteria Few (A) None   Mucus Absent Absent   Epithelial Cells, UR Per  Microscopy Few (A) None cells/hpf      Assessment & Plan:  1. Bilateral acute serous otitis media, recurrence not specified Will start Sudafed tonight. If not improvement in 2-3 days should call back and will refer to ENT. Continue other medications.   2. Painful urination 3. Unprotected sexual intercourse R/o STD versus interstitial nephritis secondary to Augmentin use. - POCT urinalysis dipstick - POCT Microscopic Urinalysis ([facility]) - Basic metabolic panel - GC/Chlamydia Probe Amp - HIV antibody - RPR   Kelechi Orgeron PA-C  [facility] 01/21/2015 7:33 PM

## 2015-01-22 ENCOUNTER — Telehealth: Payer: Self-pay | Admitting: Physician Assistant

## 2015-01-22 LAB — BASIC METABOLIC PANEL
BUN: 16 mg/dL (ref 7–25)
CALCIUM: 10 mg/dL (ref 8.6–10.3)
CO2: 25 mmol/L (ref 20–31)
CREATININE: 1.04 mg/dL (ref 0.60–1.35)
Chloride: 102 mmol/L (ref 98–110)
Glucose, Bld: 106 mg/dL — ABNORMAL HIGH (ref 65–99)
Potassium: 4.3 mmol/L (ref 3.5–5.3)
SODIUM: 139 mmol/L (ref 135–146)

## 2015-01-22 LAB — HIV ANTIBODY (ROUTINE TESTING W REFLEX): HIV 1&2 Ab, 4th Generation: NONREACTIVE

## 2015-01-22 LAB — RPR

## 2015-01-22 NOTE — Telephone Encounter (Signed)
Left vmail stating all results negative. If have any questions, can call back.

## 2015-01-23 LAB — GC/CHLAMYDIA PROBE AMP
CT PROBE, AMP APTIMA: NEGATIVE
GC PROBE AMP APTIMA: NEGATIVE

## 2015-01-26 ENCOUNTER — Telehealth: Payer: Self-pay

## 2015-01-26 NOTE — Telephone Encounter (Signed)
Pt left VM requesting lab results. Spoke to pt and informed of normal lab results.

## 2015-01-28 ENCOUNTER — Telehealth: Payer: Self-pay | Admitting: Physician Assistant

## 2015-01-28 NOTE — Telephone Encounter (Signed)
Relayed neg lab results and dysuria has cleared up.

## 2015-01-29 ENCOUNTER — Ambulatory Visit: Payer: 59 | Admitting: Family Medicine

## 2015-01-30 ENCOUNTER — Ambulatory Visit (INDEPENDENT_AMBULATORY_CARE_PROVIDER_SITE_OTHER): Payer: 59 | Admitting: Family Medicine

## 2015-01-30 ENCOUNTER — Encounter: Payer: Self-pay | Admitting: Family Medicine

## 2015-01-30 VITALS — BP 142/90 | HR 95 | Temp 98.7°F | Ht 67.0 in | Wt 188.0 lb

## 2015-01-30 DIAGNOSIS — J329 Chronic sinusitis, unspecified: Secondary | ICD-10-CM

## 2015-01-30 DIAGNOSIS — J310 Chronic rhinitis: Secondary | ICD-10-CM

## 2015-01-30 DIAGNOSIS — H65112 Acute and subacute allergic otitis media (mucoid) (sanguinous) (serous), left ear: Secondary | ICD-10-CM | POA: Diagnosis not present

## 2015-01-30 DIAGNOSIS — J301 Allergic rhinitis due to pollen: Secondary | ICD-10-CM

## 2015-01-30 MED ORDER — AMOXICILLIN-POT CLAVULANATE 875-125 MG PO TABS: 1.0000 | ORAL_TABLET | Freq: Two times a day (BID) | ORAL | Status: DC

## 2015-01-30 NOTE — Progress Notes (Signed)
   Subjective:    Patient ID: [name], male    DOB: 20-May-1985, 29 y.o.   MRN: [id number]  HPI Approximately 2 weeks ago he had difficulty with dizziness and sinus congestion as well as left ear pain. He went to an urgent care center. He was given an antibiotic for treatment of otitis. He states he is roughly 60% better but still having ear pressure. The sinus congestion and drainage have improved. He continues on Flonase and Singulair for treatment of his underlying allergies. He plans to get married in November and has a plane ride to [place] planned.   Review of Systems     Objective:   Physical Exam Alert and in no distress. Tympanic membrane on the right is normal, left is slightly dull and pink, canals are normal. Pharyngeal area is normal. Neck is supple without adenopathy or thyromegaly. Cardiac exam shows a regular sinus rhythm without murmurs or gallops. Lungs are clear to auscultation.        Assessment & Plan:  Rhinosinusitis - Plan: amoxicillin-clavulanate (AUGMENTIN) 875-125 MG tablet  Subacute allergic otitis media of left ear, recurrence not specified  Allergic rhinitis due to pollen, unspecified rhinitis seasonality  he will continue on his allergy medications. I will give him Augmentin to see if we can knock this out completely. Recommend that he carry Afrin nasal spray with him to help equilibrate the pressure while flying.

## 2015-01-30 NOTE — Patient Instructions (Signed)
Afrin nasal spray for congestion especially when you fly

## 2015-02-05 ENCOUNTER — Telehealth: Payer: Self-pay

## 2015-02-05 NOTE — Telephone Encounter (Signed)
Pt recently saw you for sinus issues and he wants to know if you can give him a referral to ENT

## 2015-02-19 NOTE — Telephone Encounter (Signed)
What is the status for this TC?

## 2015-02-19 NOTE — Telephone Encounter (Signed)
I took care of it 

## 2015-02-24 ENCOUNTER — Telehealth: Payer: Self-pay | Admitting: Family Medicine

## 2015-02-24 DIAGNOSIS — J329 Chronic sinusitis, unspecified: Secondary | ICD-10-CM

## 2015-02-24 MED ORDER — AMOXICILLIN-POT CLAVULANATE 875-125 MG PO TABS: 1.0000 | ORAL_TABLET | Freq: Two times a day (BID) | ORAL | Status: DC

## 2015-02-24 NOTE — Telephone Encounter (Signed)
Also recommend he use Afrin nasal spray for slight

## 2015-02-24 NOTE — Telephone Encounter (Signed)
Let her know that I called the antibiotic and again and have him use Afrin nasal spray for the flight

## 2015-02-24 NOTE — Telephone Encounter (Addendum)
Pt still having sinus issues. Meds seemed to helped some but still has cough with yellow to greenish phlegm sometime, head congestion, sinus pressure is somewhat better. Pt is getting married on Saturday and will be flying this week preparing for wedding. Afraid to fly feeling the way that he does.

## 2015-09-12 ENCOUNTER — Ambulatory Visit (INDEPENDENT_AMBULATORY_CARE_PROVIDER_SITE_OTHER): Payer: 59 | Admitting: Family Medicine

## 2015-09-12 VITALS — BP 118/82 | HR 103 | Temp 97.7°F | Resp 17 | Ht 67.0 in | Wt 203.0 lb

## 2015-09-12 DIAGNOSIS — J019 Acute sinusitis, unspecified: Secondary | ICD-10-CM

## 2015-09-12 DIAGNOSIS — J309 Allergic rhinitis, unspecified: Secondary | ICD-10-CM

## 2015-09-12 MED ORDER — AMOXICILLIN-POT CLAVULANATE 875-125 MG PO TABS: 1.0000 | ORAL_TABLET | Freq: Two times a day (BID) | ORAL | Status: DC

## 2015-09-12 NOTE — Patient Instructions (Addendum)
IF you received an x-ray today, you will receive an invoice from [facility]. Please contact [facility] at [phone] with questions or concerns regarding your invoice.   IF you received labwork today, you will receive an invoice from [facility]. Please contact [facility] at [phone] with questions or concerns regarding your invoice.   Our billing staff will not be able to assist you with questions regarding bills from these companies.  You will be contacted with the lab results as soon as they are available. The fastest way to get your results is to activate your My Chart account. Instructions are located on the last page of this paperwork. If you have not heard from [place] regarding the results in 2 weeks, please contact this office.    Your sinus symptoms currently are likely due to allergies and likely virus at this time. However early bacterial infection is possible. Try saline nasal spray or neti pot, drink plenty of fluids, over-the-counter Sudafed or decongestant temporarily, and you can also try Flonase for allergy symptoms. If symptoms are not improving into this week, can fill the Augmentin. If you have fevers or worsening symptoms, return for recheck here or other medical provider.  Sinusitis, Adult Sinusitis is redness, soreness, and inflammation of the paranasal sinuses. Paranasal sinuses are air pockets within the bones of your face. They are located beneath your eyes, in the middle of your forehead, and above your eyes. In healthy paranasal sinuses, mucus is able to drain out, and air is able to circulate through them by way of your nose. However, when your paranasal sinuses are inflamed, mucus and air can become trapped. This can allow bacteria and other germs to grow and cause infection. Sinusitis can develop quickly and last only a short time (acute) or continue over a long period (chronic). Sinusitis that lasts for more than 12  weeks is considered chronic. CAUSES Causes of sinusitis include:  Allergies.  Structural abnormalities, such as displacement of the cartilage that separates your nostrils (deviated septum), which can decrease the air flow through your nose and sinuses and affect sinus drainage.  Functional abnormalities, such as when the small hairs (cilia) that line your sinuses and help remove mucus do not work properly or are not present. SIGNS AND SYMPTOMS Symptoms of acute and chronic sinusitis are the same. The primary symptoms are pain and pressure around the affected sinuses. Other symptoms include:  Upper toothache.  Earache.  Headache.  Bad breath.  Decreased sense of smell and taste.  A cough, which worsens when you are lying flat.  Fatigue.  Fever.  Thick drainage from your nose, which often is green and may contain pus (purulent).  Swelling and warmth over the affected sinuses. DIAGNOSIS Your health care provider will perform a physical exam. During your exam, your health care provider may perform any of the following to help determine if you have acute sinusitis or chronic sinusitis:  Look in your nose for signs of abnormal growths in your nostrils (nasal polyps).  Tap over the affected sinus to check for signs of infection.  View the inside of your sinuses using an imaging device that has a light attached (endoscope). If your health care provider suspects that you have chronic sinusitis, one or more of the following tests may be recommended:  Allergy tests.  Nasal culture. A sample of mucus is taken from your nose, sent to a lab, and screened for bacteria.  Nasal cytology. A sample of mucus  is taken from your nose and examined by your health care provider to determine if your sinusitis is related to an allergy. TREATMENT Most cases of acute sinusitis are related to a viral infection and will resolve on their own within 10 days. Sometimes, medicines are prescribed to help  relieve symptoms of both acute and chronic sinusitis. These may include pain medicines, decongestants, nasal steroid sprays, or saline sprays. However, for sinusitis related to a bacterial infection, your health care provider will prescribe antibiotic medicines. These are medicines that will help kill the bacteria causing the infection. Rarely, sinusitis is caused by a fungal infection. In these cases, your health care provider will prescribe antifungal medicine. For some cases of chronic sinusitis, surgery is needed. Generally, these are cases in which sinusitis recurs more than 3 times per year, despite other treatments. HOME CARE INSTRUCTIONS  Drink plenty of water. Water helps thin the mucus so your sinuses can drain more easily.  Use a humidifier.  Inhale steam 3-4 times a day (for example, sit in the bathroom with the shower running).  Apply a warm, moist washcloth to your face 3-4 times a day, or as directed by your health care provider.  Use saline nasal sprays to help moisten and clean your sinuses.  Take medicines only as directed by your health care provider.  If you were prescribed either an antibiotic or antifungal medicine, finish it all even if you start to feel better. SEEK IMMEDIATE MEDICAL CARE IF:  You have increasing pain or severe headaches.  You have nausea, vomiting, or drowsiness.  You have swelling around your face.  You have vision problems.  You have a stiff neck.  You have difficulty breathing.   This information is not intended to replace advice given to you by your health care provider. Make sure you discuss any questions you have with your health care provider.   Document Released: 03/28/2005 Document Revised: 04/18/2014 Document Reviewed: 04/12/2011 Elsevier Interactive Patient Education [employer].

## 2015-09-12 NOTE — Progress Notes (Signed)
By signing my name below I, Tereasa Coop, attest that this documentation has been prepared under the direction and in the presence of Wendie Agreste, MD. Electonically Signed. Tereasa Coop, Scribe 09/12/2015 at 9:57 AM  Subjective:    Patient ID: [name], male    DOB: [birth year], 30 y.o.   MRN: [id number]  Chief Complaint  Patient presents with  . Sinusitis    HPI [name] is a 30 y.o. male who presents to the [facility] complaining of sinus congestion for the past 3 days. 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 Pt denies any fever, cough, or SOB. Pt also reports mild chest congestion.   History of allergic rhinitis. Pt used Flonase regularly until a month ago and stopped because he got out of his normal morning routine.  Pt was last seen in October 2016 by Dr Alric Seton after a visit here for otitis media and was treated with Augmentin.    Patient Active Problem List   Diagnosis Date Noted  . Allergic rhinitis due to pollen 01/28/2011   Past Medical History  Diagnosis Date  . Allergy   . Asthma    No past surgical history on file. Allergies  Allergen Reactions  . Peanuts [Peanut Oil] Shortness Of Breath   Prior to Admission medications   Medication Sig Start Date End Date Taking? Authorizing Provider  ADVAIR DISKUS 250-50 MCG/DOSE AEPB INHALE 1 PUFF INTO THE LUNGS 2 (TWO) TIMES DAILY. 01/05/15  Yes [name], MD  fluticasone [facility]) 50 MCG/ACT nasal spray Place 2 sprays into both nostrils daily. 01/16/15  Yes Posey Boyer, MD  montelukast (SINGULAIR) 10 MG tablet TAKE 1 TABLET (10 MG TOTAL) BY MOUTH AT BEDTIME. 12/22/14  Yes [name], MD   Social History   Social History  . Marital Status: Single    Spouse Name: N/A  . Number of Children: N/A  . Years of Education: N/A   Occupational History  . [employer] History  Main Topics  . Smoking status: Never Smoker   . Smokeless tobacco: Never Used  . Alcohol Use: Yes     Comment: 2 beers per week.   . Drug Use: No  . Sexual Activity: Yes   Other Topics Concern  . Not on file   Social History Narrative      Review of Systems  Constitutional: Negative for fever.  HENT: Positive for congestion and postnasal drip.   Respiratory: Negative for cough and shortness of breath.   Cardiovascular: Negative for chest pain.       Objective:   Physical Exam  Constitutional: He is oriented to person, place, and time. He appears well-developed and well-nourished.  HENT:  Head: Normocephalic and atraumatic.  Right Ear: Tympanic membrane, external ear and ear canal normal.  Left Ear: Tympanic membrane, external ear and ear canal normal.  Nose: Right sinus exhibits maxillary sinus tenderness and frontal sinus tenderness. Left sinus exhibits maxillary sinus tenderness and frontal sinus tenderness.  Mouth/Throat: Oropharynx is clear and moist and mucous membranes are normal. No oropharyngeal exudate or posterior oropharyngeal erythema.  Pt has inflammation of turbinates.  Eyes: Conjunctivae are normal. Pupils are equal, round, and reactive to light.  Neck: Neck supple.  Cardiovascular: Normal rate, regular rhythm, normal heart sounds and intact distal pulses.   No murmur heard. Pulmonary/Chest: Effort normal and breath sounds normal.  He has no wheezes. He has no rhonchi. He has no rales.  Abdominal: Soft. There is no tenderness.  Lymphadenopathy:    He has no cervical adenopathy.  Neurological: He is alert and oriented to person, place, and time.  Skin: Skin is warm and dry. No rash noted.  Psychiatric: He has a normal mood and affect. His behavior is normal.  Vitals reviewed.    Filed Vitals:   09/12/15 0936  BP: 118/82  Pulse: 103  Temp: 97.7 F (36.5 C)  TempSrc: Oral  Resp: 17  Height: 5\' 7"  (1.702 m)  Weight: 203 lb (92.08 kg)  SpO2: 99%         Assessment & Plan:   [name] is a 30 y.o. male Allergic rhinitis, unspecified allergic rhinitis type - Plan: amoxicillin-clavulanate (AUGMENTIN) 875-125 MG tablet  Acute sinusitis, recurrence not specified, unspecified location - Plan: amoxicillin-clavulanate (AUGMENTIN) 875-125 MG tablet  Suspected allergic rhinosinusitis with secondary viral illness versus early bacterial sinusitis.  - Symptomatic care discussed with saline nasal spray/netipot,  - can try his Flonase for allergic symptoms, and temporary Sudafed or other decongestant.  - If symptoms are not improving into this week, I did print Augmentin for him to fill to cover for bacterial sinusitis. RTC precautions discussed.   Meds ordered this encounter  Medications  . amoxicillin-clavulanate (AUGMENTIN) 875-125 MG tablet    Sig: Take 1 tablet by mouth 2 (two) times daily.    Dispense:  20 tablet    Refill:  0   Patient Instructions       IF you received an x-ray today, you will receive an invoice from [facility]. Please contact [facility] at [phone] with questions or concerns regarding your invoice.   IF you received labwork today, you will receive an invoice from [employer]. Please contact [facility] at [phone] with questions or concerns regarding your invoice.   Our billing staff will not be able to assist you with questions regarding bills from these companies.  You will be contacted with the lab results as soon as they are available. The fastest way to get your results is to activate your My Chart account. Instructions are located on the last page of this paperwork. If you have not heard from [place] regarding the results in 2 weeks, please contact this office.    Your sinus symptoms currently are likely due to allergies and likely virus at this time. However early bacterial infection is possible. Try saline nasal spray or neti pot, drink plenty of  fluids, over-the-counter Sudafed or decongestant temporarily, and you can also try Flonase for allergy symptoms. If symptoms are not improving into this week, can fill the Augmentin. If you have fevers or worsening symptoms, return for recheck here or other medical provider.  Sinusitis, Adult Sinusitis is redness, soreness, and inflammation of the paranasal sinuses. Paranasal sinuses are air pockets within the bones of your face. They are located beneath your eyes, in the middle of your forehead, and above your eyes. In healthy paranasal sinuses, mucus is able to drain out, and air is able to circulate through them by way of your nose. However, when your paranasal sinuses are inflamed, mucus and air can become trapped. This can allow bacteria and other germs to grow and cause infection. Sinusitis can develop quickly and last only a short time (acute) or continue over a long period (chronic). Sinusitis that lasts for more than 12 weeks is considered chronic. CAUSES Causes of sinusitis  include:  Allergies.  Structural abnormalities, such as displacement of the cartilage that separates your nostrils (deviated septum), which can decrease the air flow through your nose and sinuses and affect sinus drainage.  Functional abnormalities, such as when the small hairs (cilia) that line your sinuses and help remove mucus do not work properly or are not present. SIGNS AND SYMPTOMS Symptoms of acute and chronic sinusitis are the same. The primary symptoms are pain and pressure around the affected sinuses. Other symptoms include:  Upper toothache.  Earache.  Headache.  Bad breath.  Decreased sense of smell and taste.  A cough, which worsens when you are lying flat.  Fatigue.  Fever.  Thick drainage from your nose, which often is green and may contain pus (purulent).  Swelling and warmth over the affected sinuses. DIAGNOSIS Your health care provider will perform a physical exam. During your  exam, your health care provider may perform any of the following to help determine if you have acute sinusitis or chronic sinusitis:  Look in your nose for signs of abnormal growths in your nostrils (nasal polyps).  Tap over the affected sinus to check for signs of infection.  View the inside of your sinuses using an imaging device that has a light attached (endoscope). If your health care provider suspects that you have chronic sinusitis, one or more of the following tests may be recommended:  Allergy tests.  Nasal culture. A sample of mucus is taken from your nose, sent to a lab, and screened for bacteria.  Nasal cytology. A sample of mucus is taken from your nose and examined by your health care provider to determine if your sinusitis is related to an allergy. TREATMENT Most cases of acute sinusitis are related to a viral infection and will resolve on their own within 10 days. Sometimes, medicines are prescribed to help relieve symptoms of both acute and chronic sinusitis. These may include pain medicines, decongestants, nasal steroid sprays, or saline sprays. However, for sinusitis related to a bacterial infection, your health care provider will prescribe antibiotic medicines. These are medicines that will help kill the bacteria causing the infection. Rarely, sinusitis is caused by a fungal infection. In these cases, your health care provider will prescribe antifungal medicine. For some cases of chronic sinusitis, surgery is needed. Generally, these are cases in which sinusitis recurs more than 3 times per year, despite other treatments. HOME CARE INSTRUCTIONS  Drink plenty of water. Water helps thin the mucus so your sinuses can drain more easily.  Use a humidifier.  Inhale steam 3-4 times a day (for example, sit in the bathroom with the shower running).  Apply a warm, moist washcloth to your face 3-4 times a day, or as directed by your health care provider.  Use saline nasal sprays  to help moisten and clean your sinuses.  Take medicines only as directed by your health care provider.  If you were prescribed either an antibiotic or antifungal medicine, finish it all even if you start to feel better. SEEK IMMEDIATE MEDICAL CARE IF:  You have increasing pain or severe headaches.  You have nausea, vomiting, or drowsiness.  You have swelling around your face.  You have vision problems.  You have a stiff neck.  You have difficulty breathing.   This information is not intended to replace advice given to you by your health care provider. Make sure you discuss any questions you have with your health care provider.   Document Released: 03/28/2005 Document Revised: 04/18/2014  Document Reviewed: 04/12/2011 Elsevier Interactive Patient Education [employer].     I personally performed the services described in this documentation, which was scribed in my presence. The recorded information has been reviewed and considered, and addended by me as needed.   Signed,   Merri Ray, MD [facility].  09/12/2015 10:08 AM

## 2016-01-06 ENCOUNTER — Other Ambulatory Visit: Payer: Self-pay | Admitting: Family Medicine

## 2016-02-18 ENCOUNTER — Other Ambulatory Visit: Payer: Self-pay | Admitting: Family Medicine

## 2016-02-22 ENCOUNTER — Telehealth: Payer: Self-pay

## 2016-02-22 MED ORDER — FLUTICASONE-SALMETEROL 250-50 MCG/DOSE IN AEPB: INHALATION_SPRAY | RESPIRATORY_TRACT | 0 refills | Status: DC

## 2016-02-22 NOTE — Telephone Encounter (Signed)
[place] Advair. He has broken his Advair Diskus. Called in [facility] (Per his request.)   Advair diskus called in- pt aware he needs to make f/u appt.

## 2016-03-02 ENCOUNTER — Other Ambulatory Visit: Payer: Self-pay

## 2016-03-02 ENCOUNTER — Telehealth: Payer: Self-pay | Admitting: Family Medicine

## 2016-03-02 MED ORDER — FLUTICASONE-SALMETEROL 250-50 MCG/DOSE IN AEPB: INHALATION_SPRAY | RESPIRATORY_TRACT | 0 refills | Status: DC

## 2016-03-02 NOTE — Telephone Encounter (Signed)
Take care of this 

## 2016-03-02 NOTE — Telephone Encounter (Signed)
[facility] Pt is out of town and is having issues with asthma. He did make an appt for next week when he gets back in town. He is requesting albuterol be sent in for in to a pharmacy in  [place]. [facility] [place] [address]. Pt can be reached at [phone].

## 2016-03-02 NOTE — Telephone Encounter (Signed)
Sent in [place]

## 2016-03-08 ENCOUNTER — Ambulatory Visit (INDEPENDENT_AMBULATORY_CARE_PROVIDER_SITE_OTHER): Payer: 59 | Admitting: Family Medicine

## 2016-03-08 ENCOUNTER — Encounter: Payer: Self-pay | Admitting: Family Medicine

## 2016-03-08 VITALS — BP 130/88 | HR 89 | Ht 68.0 in | Wt 202.0 lb

## 2016-03-08 DIAGNOSIS — J4530 Mild persistent asthma, uncomplicated: Secondary | ICD-10-CM

## 2016-03-08 DIAGNOSIS — J45909 Unspecified asthma, uncomplicated: Secondary | ICD-10-CM | POA: Insufficient documentation

## 2016-03-08 DIAGNOSIS — Z23 Encounter for immunization: Secondary | ICD-10-CM | POA: Diagnosis not present

## 2016-03-08 DIAGNOSIS — Z566 Other physical and mental strain related to work: Secondary | ICD-10-CM

## 2016-03-08 DIAGNOSIS — J301 Allergic rhinitis due to pollen: Secondary | ICD-10-CM

## 2016-03-08 MED ORDER — FLUTICASONE-SALMETEROL 500-50 MCG/DOSE IN AEPB: 1.0000 | INHALATION_SPRAY | Freq: Two times a day (BID) | RESPIRATORY_TRACT | 11 refills | Status: DC

## 2016-03-08 NOTE — Progress Notes (Signed)
   Subjective:    Patient ID: [name], male    DOB: 1985-12-24, 30 y.o.   MRN: [id number]  HPI He is here for consult concerning difficulty with his underlying asthma. Over the last 2 weeks he has noted increased difficulty with chest tightness but no sore throat, earache, fever, chills. 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 He thinks that this is playing a role in this. His supervisor and the HR department are aware of the problem but don't seem to be too helpful in resolving the issue.   Review of Systems     Objective:   Physical Exam Alert and in no distress. Tympanic membranes and canals are normal. Pharyngeal area is normal. Neck is supple without adenopathy or thyromegaly. Cardiac exam shows a regular sinus rhythm without murmurs or gallops. Lungs are clear to auscultation.        Assessment & Plan:  Allergic rhinitis due to pollen, unspecified chronicity, unspecified seasonality  Mild persistent chronic asthma without complication - Plan: Flu Vaccine QUAD 36+ mos IM  Need for prophylactic vaccination and inoculation against influenza - Plan: Fluticasone-Salmeterol (ADVAIR DISKUS) 500-50 MCG/DOSE AEPB  Work stress I will increase his Advair to 500/50 and see if this helps. We also then discussed the work stress with him. I did strategize on various things he could do. He does seem to be handling this as well as possible but running into an impasse in dealing with HR not working with him. Greater than 25 minutes, over 50% spent in counseling and coordination of care.

## 2016-03-09 ENCOUNTER — Ambulatory Visit (INDEPENDENT_AMBULATORY_CARE_PROVIDER_SITE_OTHER): Payer: 59 | Admitting: Physician Assistant

## 2016-03-09 VITALS — BP 124/78 | HR 102 | Temp 98.6°F | Resp 17 | Ht 68.5 in | Wt 200.0 lb

## 2016-03-09 DIAGNOSIS — S61412A Laceration without foreign body of left hand, initial encounter: Secondary | ICD-10-CM

## 2016-03-09 NOTE — Patient Instructions (Addendum)
Please keep the xeroform in place for the next 24 hours.  Then you can remove the bandage, and wash the wound gently with soap and water.  The replace the bandage.  If the xeroform comes off, please place another piece in that area.  Do that for up to one week.  If it continues to bleed after 48 hours, please return.  Do not use peroxide or the alcohol.   Laceration Care, Adult Introduction A laceration is a cut that goes through all layers of the skin. The cut also goes into the tissue that is right under the skin. Some cuts heal on their own. Others need to be closed with stitches (sutures), staples, skin adhesive strips, or wound glue. Taking care of your cut lowers your risk of infection and helps your cut to heal better. How to take care of your cut For stitches or staples:  Keep the wound clean and dry.  If you were given a bandage (dressing), you should change it at least one time per day or as told by your doctor. You should also change it if it gets wet or dirty.  Keep the wound completely dry for the first 24 hours or as told by your doctor. After that time, you may take a shower or a bath. However, make sure that the wound is not soaked in water until after the stitches or staples have been removed.  Clean the wound one time each day or as told by your doctor:  Wash the wound with soap and water.  Rinse the wound with water until all of the soap comes off.  Pat the wound dry with a clean towel. Do not rub the wound.  After you clean the wound, put a thin layer of antibiotic ointment on it as told by your doctor. This ointment:  Helps to prevent infection.  Keeps the bandage from sticking to the wound.  Have your stitches or staples removed as told by your doctor. If your doctor used skin adhesive strips:  Keep the wound clean and dry.  If you were given a bandage, you should change it at least one time per day or as told by your doctor. You should also change it if it gets  dirty or wet.  Do not get the skin adhesive strips wet. You can take a shower or a bath, but be careful to keep the wound dry.  If the wound gets wet, pat it dry with a clean towel. Do not rub the wound.  Skin adhesive strips fall off on their own. You can trim the strips as the wound heals. Do not remove any strips that are still stuck to the wound. They will fall off after a while. If your doctor used wound glue:  Try to keep your wound dry, but you may briefly wet it in the shower or bath. Do not soak the wound in water, such as by swimming.  After you take a shower or a bath, gently pat the wound dry with a clean towel. Do not rub the wound.  Do not do any activities that will make you really sweaty until the skin glue has fallen off on its own.  Do not apply liquid, cream, or ointment medicine to your wound while the skin glue is still on.  If you were given a bandage, you should change it at least one time per day or as told by your doctor. You should also change it if it gets dirty  or wet.  If a bandage is placed over the wound, do not let the tape for the bandage touch the skin glue.  Do not pick at the glue. The skin glue usually stays on for 5-10 days. Then, it falls off of the skin. General Instructions  To help prevent scarring, make sure to cover your wound with sunscreen whenever you are outside after stitches are removed, after adhesive strips are removed, or when wound glue stays in place and the wound is healed. Make sure to wear a sunscreen of at least 30 SPF.  Take over-the-counter and prescription medicines only as told by your doctor.  If you were given antibiotic medicine or ointment, take or apply it as told by your doctor. Do not stop using the antibiotic even if your wound is getting better.  Do not scratch or pick at the wound.  Keep all follow-up visits as told by your doctor. This is important.  Check your wound every day for signs of infection. Watch  for:  Redness, swelling, or pain.  Fluid, blood, or pus.  Raise (elevate) the injured area above the level of your heart while you are sitting or lying down, if possible. Get help if:  You got a tetanus shot and you have any of these problems at the injection site:  Swelling.  Very bad pain.  Redness.  Bleeding.  You have a fever.  A wound that was closed breaks open.  You notice a bad smell coming from your wound or your bandage.  You notice something coming out of the wound, such as wood or glass.  Medicine does not help your pain.  You have more redness, swelling, or pain at the site of your wound.  You have fluid, blood, or pus coming from your wound.  You notice a change in the color of your skin near your wound.  You need to change the bandage often because fluid, blood, or pus is coming from the wound.  You start to have a new rash.  You start to have numbness around the wound. Get help right away if:  You have very bad swelling around the wound.  Your pain suddenly gets worse and is very bad.  You notice painful lumps near the wound or on skin that is anywhere on your body.  You have a red streak going away from your wound.  The wound is on your hand or foot and you cannot move a finger or toe like you usually can.  The wound is on your hand or foot and you notice that your fingers or toes look pale or bluish. This information is not intended to replace advice given to you by your health care provider. Make sure you discuss any questions you have with your health care provider. Document Released: 09/14/2007 Document Revised: 09/03/2015 Document Reviewed: 03/24/2014  2017 Elsevier     IF you received an x-ray today, you will receive an invoice from [facility]. Please contact [facility] at [phone] with questions or concerns regarding your invoice.   IF you received labwork today, you will receive an invoice from [employer]. Please contact [facility] at [phone] with questions or concerns regarding your invoice.   Our billing staff will not be able to assist you with questions regarding bills from these companies.  You will be contacted with the lab results as soon as they are available. The fastest way to get your results is to activate your My Chart  account. Instructions are located on the last page of this paperwork. If you have not heard from [place] regarding the results in 2 weeks, please contact this office.

## 2016-03-15 NOTE — Progress Notes (Signed)
[facility] [address], [place] [address] [phone]  Date:  03/09/2016   Name:  [name]   DOB:  Oct 22, 1985   MRN:  [id number]  PCP:  Wyatt Haste, MD    History of Present Illness:  [name] is a 30 y.o. male patient who presents to [facility] for left index finger cut. Patient was cutting food when he cut the tip of his finger yesterday.  He was able to rinse and use alcohol and hydrogen peroxide, but has not been able to stop the bleeding.   He is not on a blood thinner.  No hx of bleeding disorder.   Patient Active Problem List   Diagnosis Date Noted  . Chronic asthma 03/08/2016  . Allergic rhinitis due to pollen 01/28/2011    Past Medical History:  Diagnosis Date  . Allergy   . Asthma     No past surgical history on file.  Social History  Substance Use Topics  . Smoking status: Never Smoker  . Smokeless tobacco: Never Used  . Alcohol use Yes     Comment: 2 beers per week.     Family History  Problem Relation Age of Onset  . Diabetes Maternal Grandfather   . Diabetes Paternal Grandfather   . Cancer Neg Hx     Allergies  Allergen Reactions  . Peanuts [Peanut Oil] Shortness Of Breath    Medication list has been reviewed and updated.  Current Outpatient Prescriptions on File Prior to Visit  Medication Sig Dispense Refill  . fluticasone (FLONASE) 50 MCG/ACT nasal spray Place 2 sprays into both nostrils daily. 16 g 6  . Fluticasone-Salmeterol (ADVAIR DISKUS) 500-50 MCG/DOSE AEPB Inhale 1 puff into the lungs 2 (two) times daily. 60 each 11  . montelukast (SINGULAIR) 10 MG tablet TAKE 1 TABLET (10 MG TOTAL) BY MOUTH AT BEDTIME. 90 tablet 0   No current facility-administered medications on file prior to visit.     ROS ROS otherwise unremarkable unless listed above.   Physical Examination: BP 124/78 (BP Location: Right Arm, Patient Position: Sitting, Cuff Size: Normal)   Pulse (!) 102   Temp 98.6 F (37 C)  (Oral)   Resp 17   Ht 5' 8.5" (1.74 m)   Wt 200 lb (90.7 kg)   SpO2 97%   BMI 29.97 kg/m  Ideal Body Weight: Weight in (lb) to have BMI = 25: 166.5  Physical Exam  Constitutional: He is oriented to person, place, and time. He appears well-developed and well-nourished. No distress.  HENT:  Head: Normocephalic and atraumatic.  Eyes: Conjunctivae and EOM are normal. Pupils are equal, round, and reactive to light.  Cardiovascular: Normal rate.   Pulmonary/Chest: Effort normal. No respiratory distress.  Neurological: He is alert and oriented to person, place, and time.  Skin: Skin is warm and dry. He is not diaphoretic.  Index left with distal laceration without tendon involvement.  Sanguinous pulsing.    Psychiatric: He has a normal mood and affect. His behavior is normal.   Procedure: Cleansed with soap and water.  Dried.  [place].  Placed Drysol 5 times.  Xeroform placed.  Pressure dressings placed.   Assessment and Plan: [name] is a 30 y.o. male who is here today for laceration of left index finger. tdap utd as reviewed Laceration of left hand, foreign body presence unspecified, initial encounter  Ivar Drape, PA-C [facility] 12/5/20179:07 AM

## 2016-05-23 ENCOUNTER — Ambulatory Visit (INDEPENDENT_AMBULATORY_CARE_PROVIDER_SITE_OTHER): Payer: 59 | Admitting: Family Medicine

## 2016-05-23 DIAGNOSIS — J069 Acute upper respiratory infection, unspecified: Secondary | ICD-10-CM | POA: Diagnosis not present

## 2016-05-23 DIAGNOSIS — B9789 Other viral agents as the cause of diseases classified elsewhere: Secondary | ICD-10-CM

## 2016-05-23 NOTE — Progress Notes (Signed)
     Subjective:    Patient ID: [name], male    DOB: [birth year], 31 y.o.   MRN: [id number]  Chief Complaint  Patient presents with  . Sinusitis    pressure x 2 days    PCP: Wyatt Haste, MD  HPI  This is a 31 y.o. male who is presenting with a 2 day history of sinus pressure. He denies any fevers or chills. He is having some green discharge. He hasn't had any itchy or watery eyes. He hasn't had any cough. He has tried sudafed. He denies any sick contacts. He reports having a history of sinus infections in the past.   Review of Systems  ROS: No unexpected weight loss, fever, chills, swelling, instability, muscle pain, numbness/tingling, redness, otherwise see HPI   Social: no tobacco or alcohol use    Patient Active Problem List   Diagnosis Date Noted  . URI (upper respiratory infection) 05/23/2016  . Chronic asthma 03/08/2016  . Allergic rhinitis due to pollen 01/28/2011     Prior to Admission medications   Medication Sig Start Date End Date Taking? Authorizing Provider  fluticasone (FLONASE) 50 MCG/ACT nasal spray Place 2 sprays into both nostrils daily. 01/16/15  Yes Posey Boyer, MD  Fluticasone-Salmeterol (ADVAIR DISKUS) 500-50 MCG/DOSE AEPB Inhale 1 puff into the lungs 2 (two) times daily. 03/08/16  Yes [name], MD  montelukast (SINGULAIR) 10 MG tablet TAKE 1 TABLET (10 MG TOTAL) BY MOUTH AT BEDTIME. 12/22/14  Yes [name], MD     Allergies  Allergen Reactions  . Peanuts [Peanut Oil] Shortness Of Breath      Objective:   Physical Exam BP 132/84 (Cuff Size: Large)   Pulse 95   Temp 98.3 F (36.8 C) (Oral)   Resp 16   Wt 205 lb 12.8 oz (93.4 kg)   SpO2 97%   BMI 30.84 kg/m  Gen: NAD, alert, cooperative with exam, well-appearing HEENT: NCAT, EOMI, clear conjunctiva, oropharynx clear, supple neck, TM's clear and intact b/l, no cervical LAD, no tonsillar exudates, left turbinate erythematous and swollen, frontal and maxillary  tenderness on right more than left,  CV: RRR, good S1/S2, no murmur, no edema, capillary refill brisk  Resp: CTABL, no wheezes, non-labored Skin: no rashes, normal turgor  Neuro: no gross deficits.  Psych:  alert and oriented      Assessment & Plan:   URI (upper respiratory infection) Symptoms consistent with a viral origin. He's had them for two days.  - encouraged supportive care  - encouraged to follow up if he develops a fever or symptoms worsen.

## 2016-05-23 NOTE — Patient Instructions (Addendum)
Thank you for coming in,   You can try things such as zyrtec-D or allegra-D which is an antihistamine and decongestant.   You can try afrin which will help with nasal congestion but use for only three days.   You can also try using a netti pot on a regular occasion.   Please follow up with [place] if you develop a fever or your symptoms seem to worsen.     Please feel free to call with any questions or concerns at any time, at [phone]. --Dr. Raeford Razor     IF you received an x-ray today, you will receive an invoice from [facility]. Please contact [facility] at [phone] with questions or concerns regarding your invoice.   IF you received labwork today, you will receive an invoice from [facility]. Please contact [facility] at [phone] with questions or concerns regarding your invoice.   Our billing staff will not be able to assist you with questions regarding bills from these companies.  You will be contacted with the lab results as soon as they are available. The fastest way to get your results is to activate your My Chart account. Instructions are located on the last page of this paperwork. If you have not heard from [place] regarding the results in 2 weeks, please contact this office.

## 2016-05-23 NOTE — Assessment & Plan Note (Signed)
Symptoms consistent with a viral origin. He's had them for two days.  - encouraged supportive care  - encouraged to follow up if he develops a fever or symptoms worsen.

## 2016-06-06 NOTE — Progress Notes (Signed)
Called pt to see how he was and to see if he needed follow up.  He said he had some severe sinus issues, I offered him an appt and he said he was just going to to go an ENT.

## 2016-07-06 DIAGNOSIS — J343 Hypertrophy of nasal turbinates: Secondary | ICD-10-CM | POA: Diagnosis not present

## 2016-07-06 DIAGNOSIS — J310 Chronic rhinitis: Secondary | ICD-10-CM | POA: Diagnosis not present

## 2016-07-06 DIAGNOSIS — J342 Deviated nasal septum: Secondary | ICD-10-CM | POA: Diagnosis not present

## 2016-07-10 DIAGNOSIS — J342 Deviated nasal septum: Secondary | ICD-10-CM

## 2016-07-10 DIAGNOSIS — J343 Hypertrophy of nasal turbinates: Secondary | ICD-10-CM

## 2016-07-10 HISTORY — DX: Hypertrophy of nasal turbinates: J34.3

## 2016-07-10 HISTORY — DX: Deviated nasal septum: J34.2

## 2016-07-20 ENCOUNTER — Other Ambulatory Visit: Payer: Self-pay | Admitting: Otolaryngology

## 2016-08-08 ENCOUNTER — Encounter (HOSPITAL_BASED_OUTPATIENT_CLINIC_OR_DEPARTMENT_OTHER): Payer: Self-pay | Admitting: *Deleted

## 2016-08-15 ENCOUNTER — Ambulatory Visit (HOSPITAL_BASED_OUTPATIENT_CLINIC_OR_DEPARTMENT_OTHER)
Admission: RE | Admit: 2016-08-15 | Discharge: 2016-08-15 | Disposition: A | Discharge status: Home / Self Care | Payer: 59 | Source: Ambulatory Visit | Attending: Otolaryngology | Admitting: Otolaryngology

## 2016-08-15 ENCOUNTER — Encounter (HOSPITAL_BASED_OUTPATIENT_CLINIC_OR_DEPARTMENT_OTHER): Payer: Self-pay | Admitting: Anesthesiology

## 2016-08-15 ENCOUNTER — Encounter (HOSPITAL_BASED_OUTPATIENT_CLINIC_OR_DEPARTMENT_OTHER)
Admission: RE | Disposition: A | Discharge status: Home / Self Care | Payer: Self-pay | Source: Ambulatory Visit | Attending: Otolaryngology

## 2016-08-15 DIAGNOSIS — J342 Deviated nasal septum: Secondary | ICD-10-CM | POA: Diagnosis not present

## 2016-08-15 DIAGNOSIS — Z5309 Procedure and treatment not carried out because of other contraindication: Secondary | ICD-10-CM | POA: Insufficient documentation

## 2016-08-15 HISTORY — DX: Hypertrophy of nasal turbinates: J34.3

## 2016-08-15 HISTORY — DX: Deviated nasal septum: J34.2

## 2016-08-15 HISTORY — DX: Personal history of traumatic brain injury: Z87.820

## 2016-08-15 SURGERY — SEPTOPLASTY, NOSE, WITH NASAL TURBINATE REDUCTION
Anesthesia: General | Laterality: Bilateral

## 2016-08-15 MED ORDER — LACTATED RINGERS IV SOLN: INTRAVENOUS | Status: DC

## 2016-08-15 MED ORDER — SCOPOLAMINE 1 MG/3DAYS TD PT72: 1.0000 | MEDICATED_PATCH | Freq: Once | TRANSDERMAL | Status: DC | PRN

## 2016-08-15 MED ORDER — FENTANYL CITRATE (PF) 100 MCG/2ML IJ SOLN: 50.0000 ug | INTRAMUSCULAR | Status: DC | PRN

## 2016-08-15 MED ORDER — MIDAZOLAM HCL 2 MG/2ML IJ SOLN: 1.0000 mg | INTRAMUSCULAR | Status: DC | PRN

## 2016-08-15 SURGICAL SUPPLY — 29 items
ATTRACTOMAT 16X20 MAGNETIC DRP (DRAPES) IMPLANT
CANISTER SUCT 1200ML W/VALVE (MISCELLANEOUS) ×2 IMPLANT
COAGULATOR SUCT 8FR VV (MISCELLANEOUS) ×2 IMPLANT
DECANTER SPIKE VIAL GLASS SM (MISCELLANEOUS) IMPLANT
DRSG NASOPORE 8CM (GAUZE/BANDAGES/DRESSINGS) IMPLANT
DRSG TELFA 3X8 NADH (GAUZE/BANDAGES/DRESSINGS) IMPLANT
ELECT REM PT RETURN 9FT ADLT (ELECTROSURGICAL) ×2
ELECTRODE REM PT RTRN 9FT ADLT (ELECTROSURGICAL) ×1 IMPLANT
GLOVE BIO SURGEON STRL SZ7.5 (GLOVE) ×2 IMPLANT
GOWN STRL REUS W/ TWL LRG LVL3 (GOWN DISPOSABLE) ×2 IMPLANT
GOWN STRL REUS W/TWL LRG LVL3 (GOWN DISPOSABLE) ×2
NEEDLE HYPO 25X1 1.5 SAFETY (NEEDLE) ×2 IMPLANT
NS IRRIG 1000ML POUR BTL (IV SOLUTION) ×2 IMPLANT
PACK BASIN DAY SURGERY FS (CUSTOM PROCEDURE TRAY) ×2 IMPLANT
PACK ENT DAY SURGERY (CUSTOM PROCEDURE TRAY) ×2 IMPLANT
SLEEVE SCD COMPRESS KNEE MED (MISCELLANEOUS) IMPLANT
SOLUTION BUTLER CLEAR DIP (MISCELLANEOUS) ×2 IMPLANT
SPLINT NASAL AIRWAY SILICONE (MISCELLANEOUS) IMPLANT
SPONGE GAUZE 2X2 8PLY STRL LF (GAUZE/BANDAGES/DRESSINGS) ×2 IMPLANT
SPONGE NEURO XRAY DETECT 1X3 (DISPOSABLE) ×2 IMPLANT
SUT CHROMIC 4 0 P 3 18 (SUTURE) ×2 IMPLANT
SUT PLAIN 4 0 ~~LOC~~ 1 (SUTURE) ×2 IMPLANT
SUT PROLENE 3 0 PS 2 (SUTURE) ×2 IMPLANT
SUT VIC AB 4-0 P-3 18XBRD (SUTURE) IMPLANT
SUT VIC AB 4-0 P3 18 (SUTURE)
TOWEL OR 17X24 6PK STRL BLUE (TOWEL DISPOSABLE) ×2 IMPLANT
TUBE SALEM SUMP 12R W/ARV (TUBING) IMPLANT
TUBE SALEM SUMP 16 FR W/ARV (TUBING) ×2 IMPLANT
YANKAUER SUCT BULB TIP NO VENT (SUCTIONS) ×2 IMPLANT

## 2016-08-15 NOTE — Anesthesia Preprocedure Evaluation (Signed)
Anesthesia Evaluation  Patient identified by MRN, date of birth, ID band Patient awake    Reviewed: Allergy & Precautions, H&P , NPO status , Patient's Chart, lab work & pertinent test results  Airway Mallampati: II   Neck ROM: full    Dental   Pulmonary asthma ,    breath sounds clear to auscultation       Cardiovascular negative cardio ROS   Rhythm:regular Rate:Normal     Neuro/Psych    GI/Hepatic   Endo/Other    Renal/GU      Musculoskeletal   Abdominal   Peds  Hematology   Anesthesia Other Findings   Reproductive/Obstetrics                             Anesthesia Physical Anesthesia Plan  ASA: II  Anesthesia Plan: General   Post-op Pain Management:    Induction: Intravenous  Airway Management Planned: Oral ETT  Additional Equipment:   Intra-op Plan:   Post-operative Plan: Extubation in OR  Informed Consent: I have reviewed the patients History and Physical, chart, labs and discussed the procedure including the risks, benefits and alternatives for the proposed anesthesia with the patient or authorized representative who has indicated his/her understanding and acceptance.     Plan Discussed with: CRNA, Anesthesiologist and Surgeon  Anesthesia Plan Comments:         Anesthesia Quick Evaluation

## 2016-08-15 NOTE — Progress Notes (Signed)
Surgery canceled this morning because pt has taken [place] every day for the last week. States he was unaware that he was not to take this. Pt informed of all medicines he is not to take for the two weeks before surgery by Dr. Benjamine Mola and also by myself.

## 2016-08-16 ENCOUNTER — Other Ambulatory Visit: Payer: Self-pay | Admitting: Otolaryngology

## 2016-09-13 ENCOUNTER — Encounter (HOSPITAL_BASED_OUTPATIENT_CLINIC_OR_DEPARTMENT_OTHER): Payer: Self-pay | Admitting: *Deleted

## 2016-09-20 ENCOUNTER — Encounter (HOSPITAL_BASED_OUTPATIENT_CLINIC_OR_DEPARTMENT_OTHER)
Admission: RE | Disposition: A | Discharge status: Home / Self Care | Payer: Self-pay | Source: Ambulatory Visit | Attending: Otolaryngology

## 2016-09-20 ENCOUNTER — Ambulatory Visit (HOSPITAL_BASED_OUTPATIENT_CLINIC_OR_DEPARTMENT_OTHER)
Admission: RE | Admit: 2016-09-20 | Discharge: 2016-09-20 | Disposition: A | Discharge status: Home / Self Care | Payer: 59 | Source: Ambulatory Visit | Attending: Otolaryngology | Admitting: Otolaryngology

## 2016-09-20 ENCOUNTER — Encounter (HOSPITAL_BASED_OUTPATIENT_CLINIC_OR_DEPARTMENT_OTHER): Payer: Self-pay | Admitting: *Deleted

## 2016-09-20 ENCOUNTER — Ambulatory Visit (HOSPITAL_BASED_OUTPATIENT_CLINIC_OR_DEPARTMENT_OTHER): Payer: 59 | Admitting: Anesthesiology

## 2016-09-20 DIAGNOSIS — J342 Deviated nasal septum: Secondary | ICD-10-CM | POA: Insufficient documentation

## 2016-09-20 DIAGNOSIS — J310 Chronic rhinitis: Secondary | ICD-10-CM | POA: Diagnosis not present

## 2016-09-20 DIAGNOSIS — R0981 Nasal congestion: Secondary | ICD-10-CM | POA: Diagnosis present

## 2016-09-20 DIAGNOSIS — J343 Hypertrophy of nasal turbinates: Secondary | ICD-10-CM | POA: Insufficient documentation

## 2016-09-20 DIAGNOSIS — J330 Polyp of nasal cavity: Secondary | ICD-10-CM | POA: Diagnosis not present

## 2016-09-20 DIAGNOSIS — J3489 Other specified disorders of nose and nasal sinuses: Secondary | ICD-10-CM | POA: Diagnosis not present

## 2016-09-20 DIAGNOSIS — J339 Nasal polyp, unspecified: Secondary | ICD-10-CM | POA: Insufficient documentation

## 2016-09-20 HISTORY — PX: POLYPECTOMY: SHX149

## 2016-09-20 HISTORY — PX: NASAL SEPTOPLASTY W/ TURBINOPLASTY: SHX2070

## 2016-09-20 HISTORY — PX: EXCISION CHONCHA BULLOSA: SHX6435

## 2016-09-20 SURGERY — SEPTOPLASTY, NOSE, WITH NASAL TURBINATE REDUCTION
Anesthesia: General | Site: Nose | Laterality: Left

## 2016-09-20 MED ORDER — HYDROMORPHONE HCL 1 MG/ML IJ SOLN
INTRAMUSCULAR | Status: AC
  Filled 2016-09-20: qty 0.5

## 2016-09-20 MED ORDER — HYDROMORPHONE HCL 1 MG/ML IJ SOLN
0.2500 mg | INTRAMUSCULAR | Status: DC | PRN
  Administered 2016-09-20 (×3): 0.5 mg via INTRAVENOUS

## 2016-09-20 MED ORDER — SUCCINYLCHOLINE CHLORIDE 200 MG/10ML IV SOSY
PREFILLED_SYRINGE | INTRAVENOUS | Status: AC
  Filled 2016-09-20: qty 10

## 2016-09-20 MED ORDER — LACTATED RINGERS IV SOLN
INTRAVENOUS | Status: DC
  Administered 2016-09-20: 10:00:00 via INTRAVENOUS
  Administered 2016-09-20: 10 mL/h via INTRAVENOUS
  Administered 2016-09-20: 09:00:00 via INTRAVENOUS

## 2016-09-20 MED ORDER — OXYCODONE HCL 5 MG/5ML PO SOLN: 5.0000 mg | Freq: Once | ORAL | Status: DC | PRN

## 2016-09-20 MED ORDER — OXYCODONE-ACETAMINOPHEN 5-325 MG PO TABS: 1.0000 | ORAL_TABLET | ORAL | 0 refills | Status: DC | PRN

## 2016-09-20 MED ORDER — ESMOLOL HCL 100 MG/10ML IV SOLN
INTRAVENOUS | Status: DC | PRN
  Administered 2016-09-20: 10 mg via INTRAVENOUS

## 2016-09-20 MED ORDER — MIDAZOLAM HCL 2 MG/2ML IJ SOLN
INTRAMUSCULAR | Status: AC
  Filled 2016-09-20: qty 2

## 2016-09-20 MED ORDER — HYDRALAZINE HCL 20 MG/ML IJ SOLN
INTRAMUSCULAR | Status: AC
  Filled 2016-09-20: qty 1

## 2016-09-20 MED ORDER — AMOXICILLIN 875 MG PO TABS: 875.0000 mg | ORAL_TABLET | Freq: Two times a day (BID) | ORAL | 0 refills | Status: DC

## 2016-09-20 MED ORDER — MIDAZOLAM HCL 2 MG/2ML IJ SOLN
1.0000 mg | INTRAMUSCULAR | Status: DC | PRN
  Administered 2016-09-20: 2 mg via INTRAVENOUS

## 2016-09-20 MED ORDER — FENTANYL CITRATE (PF) 100 MCG/2ML IJ SOLN
50.0000 ug | INTRAMUSCULAR | Status: AC | PRN
  Administered 2016-09-20 (×2): 25 ug via INTRAVENOUS
  Administered 2016-09-20: 50 ug via INTRAVENOUS
  Administered 2016-09-20: 100 ug via INTRAVENOUS
  Administered 2016-09-20 (×2): 50 ug via INTRAVENOUS

## 2016-09-20 MED ORDER — HYDRALAZINE HCL 20 MG/ML IJ SOLN
5.0000 mg | Freq: Once | INTRAMUSCULAR | Status: AC
  Administered 2016-09-20: 5 mg via INTRAVENOUS

## 2016-09-20 MED ORDER — LIDOCAINE-EPINEPHRINE 1 %-1:100000 IJ SOLN
INTRAMUSCULAR | Status: DC | PRN
  Administered 2016-09-20: 4 mL

## 2016-09-20 MED ORDER — FENTANYL CITRATE (PF) 100 MCG/2ML IJ SOLN
INTRAMUSCULAR | Status: AC
  Filled 2016-09-20: qty 2

## 2016-09-20 MED ORDER — OXYCODONE HCL 5 MG PO TABS: 5.0000 mg | ORAL_TABLET | Freq: Once | ORAL | Status: DC | PRN

## 2016-09-20 MED ORDER — LIDOCAINE 2% (20 MG/ML) 5 ML SYRINGE
INTRAMUSCULAR | Status: AC
  Filled 2016-09-20: qty 5

## 2016-09-20 MED ORDER — PROMETHAZINE HCL 25 MG/ML IJ SOLN: 6.2500 mg | INTRAMUSCULAR | Status: DC | PRN

## 2016-09-20 MED ORDER — MEPERIDINE HCL 25 MG/ML IJ SOLN: 6.2500 mg | INTRAMUSCULAR | Status: DC | PRN

## 2016-09-20 MED ORDER — DEXAMETHASONE SODIUM PHOSPHATE 4 MG/ML IJ SOLN
INTRAMUSCULAR | Status: DC | PRN
  Administered 2016-09-20: 10 mg via INTRAVENOUS

## 2016-09-20 MED ORDER — METOPROLOL TARTRATE 5 MG/5ML IV SOLN
INTRAVENOUS | Status: DC | PRN
  Administered 2016-09-20: 2.5 mg via INTRAVENOUS
  Administered 2016-09-20: 1 mg via INTRAVENOUS
  Administered 2016-09-20: 1.5 mg via INTRAVENOUS

## 2016-09-20 MED ORDER — SUCCINYLCHOLINE CHLORIDE 20 MG/ML IJ SOLN
INTRAMUSCULAR | Status: DC | PRN
  Administered 2016-09-20: 100 mg via INTRAVENOUS

## 2016-09-20 MED ORDER — ONDANSETRON HCL 4 MG/2ML IJ SOLN
INTRAMUSCULAR | Status: AC
  Filled 2016-09-20: qty 2

## 2016-09-20 MED ORDER — CEFAZOLIN SODIUM-DEXTROSE 2-3 GM-% IV SOLR
INTRAVENOUS | Status: DC | PRN
  Administered 2016-09-20: 2 g via INTRAVENOUS

## 2016-09-20 MED ORDER — PROPOFOL 10 MG/ML IV BOLUS
INTRAVENOUS | Status: DC | PRN
  Administered 2016-09-20: 200 mg via INTRAVENOUS

## 2016-09-20 MED ORDER — LIDOCAINE 2% (20 MG/ML) 5 ML SYRINGE
INTRAMUSCULAR | Status: DC | PRN
  Administered 2016-09-20: 100 mg via INTRAVENOUS

## 2016-09-20 MED ORDER — ONDANSETRON HCL 4 MG/2ML IJ SOLN
INTRAMUSCULAR | Status: DC | PRN
  Administered 2016-09-20: 4 mg via INTRAVENOUS

## 2016-09-20 MED ORDER — MUPIROCIN 2 % EX OINT
TOPICAL_OINTMENT | CUTANEOUS | Status: DC | PRN
  Administered 2016-09-20: 1 via NASAL

## 2016-09-20 MED ORDER — CEFAZOLIN SODIUM 1 G IJ SOLR
INTRAMUSCULAR | Status: AC
  Filled 2016-09-20: qty 20

## 2016-09-20 MED ORDER — SCOPOLAMINE 1 MG/3DAYS TD PT72: 1.0000 | MEDICATED_PATCH | Freq: Once | TRANSDERMAL | Status: DC | PRN

## 2016-09-20 MED ORDER — DEXAMETHASONE SODIUM PHOSPHATE 10 MG/ML IJ SOLN
INTRAMUSCULAR | Status: AC
  Filled 2016-09-20: qty 1

## 2016-09-20 MED ORDER — OXYMETAZOLINE HCL 0.05 % NA SOLN
NASAL | Status: DC | PRN
  Administered 2016-09-20: 1 via TOPICAL

## 2016-09-20 SURGICAL SUPPLY — 39 items
ATTRACTOMAT 16X20 MAGNETIC DRP (DRAPES) IMPLANT
BLADE TRICUT ROTATE M4 4 5PK (BLADE) ×4 IMPLANT
CANISTER SUCT 1200ML W/VALVE (MISCELLANEOUS) ×4 IMPLANT
COAGULATOR SUCT 8FR VV (MISCELLANEOUS) ×4 IMPLANT
DECANTER SPIKE VIAL GLASS SM (MISCELLANEOUS) IMPLANT
DRSG NASOPORE 8CM (GAUZE/BANDAGES/DRESSINGS) IMPLANT
DRSG TELFA 3X8 NADH (GAUZE/BANDAGES/DRESSINGS) IMPLANT
ELECT REM PT RETURN 9FT ADLT (ELECTROSURGICAL) ×4
ELECTRODE REM PT RTRN 9FT ADLT (ELECTROSURGICAL) ×3 IMPLANT
GLOVE BIO SURGEON STRL SZ7 (GLOVE) ×8 IMPLANT
GLOVE BIO SURGEON STRL SZ7.5 (GLOVE) ×4 IMPLANT
GLOVE BIOGEL PI IND STRL 6.5 (GLOVE) ×3 IMPLANT
GLOVE BIOGEL PI INDICATOR 6.5 (GLOVE) ×1
GLOVE SURG SS PI 6.5 STRL IVOR (GLOVE) ×4 IMPLANT
GOWN STRL REUS W/ TWL LRG LVL3 (GOWN DISPOSABLE) ×6 IMPLANT
GOWN STRL REUS W/ TWL XL LVL3 (GOWN DISPOSABLE) ×6 IMPLANT
GOWN STRL REUS W/TWL LRG LVL3 (GOWN DISPOSABLE) ×2
GOWN STRL REUS W/TWL XL LVL3 (GOWN DISPOSABLE) ×2
IV NS 500ML (IV SOLUTION) ×1
IV NS 500ML BAXH (IV SOLUTION) ×3 IMPLANT
NEEDLE HYPO 25X1 1.5 SAFETY (NEEDLE) ×4 IMPLANT
NS IRRIG 1000ML POUR BTL (IV SOLUTION) ×4 IMPLANT
PACK BASIN DAY SURGERY FS (CUSTOM PROCEDURE TRAY) ×4 IMPLANT
PACK ENT DAY SURGERY (CUSTOM PROCEDURE TRAY) ×4 IMPLANT
SLEEVE SCD COMPRESS KNEE MED (MISCELLANEOUS) IMPLANT
SOLUTION BUTLER CLEAR DIP (MISCELLANEOUS) ×4 IMPLANT
SPLINT NASAL AIRWAY SILICONE (MISCELLANEOUS) IMPLANT
SPONGE GAUZE 2X2 8PLY STRL LF (GAUZE/BANDAGES/DRESSINGS) ×4 IMPLANT
SPONGE NEURO XRAY DETECT 1X3 (DISPOSABLE) ×4 IMPLANT
SUT CHROMIC 4 0 P 3 18 (SUTURE) ×4 IMPLANT
SUT PLAIN 4 0 ~~LOC~~ 1 (SUTURE) ×4 IMPLANT
SUT PROLENE 3 0 PS 2 (SUTURE) ×4 IMPLANT
SUT VIC AB 4-0 P-3 18XBRD (SUTURE) IMPLANT
SUT VIC AB 4-0 P3 18 (SUTURE)
TOWEL OR 17X24 6PK STRL BLUE (TOWEL DISPOSABLE) ×4 IMPLANT
TUBE CONNECTING 20X1/4 (TUBING) ×4 IMPLANT
TUBE SALEM SUMP 12R W/ARV (TUBING) IMPLANT
TUBE SALEM SUMP 16 FR W/ARV (TUBING) ×4 IMPLANT
YANKAUER SUCT BULB TIP NO VENT (SUCTIONS) ×4 IMPLANT

## 2016-09-20 NOTE — Transfer of Care (Signed)
Immediate Anesthesia Transfer of Care Note  Patient: [name]  Procedure(s) Performed: Procedure(s): NASAL SEPTOPLASTY WITH BILATERAL TURBINATE REDUCTION (Bilateral) EXCISION CHONCHA BULLOSA (Left) POLYPECTOMY NASAL (Bilateral)  Patient Location: PACU  Anesthesia Type:General  Level of Consciousness: awake and alert   Airway & Oxygen Therapy: Patient Spontanous Breathing and Patient connected to face mask oxygen  Post-op Assessment: Report given to RN and Post -op Vital signs reviewed and stable  Post vital signs: Reviewed and stable  Last Vitals:  Vitals:   09/20/16 0827  BP: 137/87  Pulse: (!) 101  Resp: 18  Temp: 36.8 C    Last Pain:  Vitals:   09/20/16 0827  TempSrc: Oral      Patients Stated Pain Goal: 0 (64/15/83 0940)  Complications: No apparent anesthesia complications

## 2016-09-20 NOTE — Anesthesia Postprocedure Evaluation (Signed)
Anesthesia Post Note  Patient: [name]  Procedure(s) Performed: Procedure(s) (LRB): NASAL SEPTOPLASTY WITH BILATERAL TURBINATE REDUCTION (Bilateral) EXCISION CHONCHA BULLOSA (Left) POLYPECTOMY NASAL (Bilateral)     Patient location during evaluation: PACU Anesthesia Type: General Level of consciousness: sedated and patient cooperative Pain management: pain level controlled Vital Signs Assessment: post-procedure vital signs reviewed and stable Respiratory status: spontaneous breathing Cardiovascular status: stable Anesthetic complications: no    Last Vitals:  Vitals:   09/20/16 1230 09/20/16 1245  BP: (!) 155/104 (!) 144/100  Pulse: 93 91  Resp: 11 (!) 8  Temp:      Last Pain:  Vitals:   09/20/16 1230  TempSrc:   PainSc: [place]

## 2016-09-20 NOTE — Anesthesia Preprocedure Evaluation (Signed)
Anesthesia Evaluation  Patient identified by MRN, date of birth, ID band Patient awake    Reviewed: Allergy & Precautions, H&P , NPO status , Patient's Chart, lab work & pertinent test results  Airway Mallampati: II   Neck ROM: full    Dental   Pulmonary asthma ,    breath sounds clear to auscultation       Cardiovascular negative cardio ROS   Rhythm:regular Rate:Normal     Neuro/Psych    GI/Hepatic   Endo/Other    Renal/GU      Musculoskeletal   Abdominal   Peds  Hematology   Anesthesia Other Findings   Reproductive/Obstetrics                            Anesthesia Physical  Anesthesia Plan  ASA: II  Anesthesia Plan: General   Post-op Pain Management:    Induction: Intravenous  PONV Risk Score and Plan: 2 and Ondansetron and Dexamethasone  Airway Management Planned: Oral ETT  Additional Equipment:   Intra-op Plan:   Post-operative Plan: Extubation in OR  Informed Consent: I have reviewed the patients History and Physical, chart, labs and discussed the procedure including the risks, benefits and alternatives for the proposed anesthesia with the patient or authorized representative who has indicated his/her understanding and acceptance.   Dental advisory given  Plan Discussed with: CRNA  Anesthesia Plan Comments:        Anesthesia Quick Evaluation

## 2016-09-20 NOTE — Anesthesia Procedure Notes (Signed)
Procedure Name: Intubation Date/Time: 09/20/2016 9:50 AM Performed by: Lieutenant Diego Pre-anesthesia Checklist: Patient identified, Emergency Drugs available, Suction available and Patient being monitored Patient Re-evaluated:Patient Re-evaluated prior to inductionOxygen Delivery Method: Circle system utilized Preoxygenation: Pre-oxygenation with 100% oxygen Intubation Type: IV induction Ventilation: Mask ventilation without difficulty Laryngoscope Size: Miller and 2 Grade View: Grade I Tube type: Oral Tube size: 7.0 mm Number of attempts: 1 Airway Equipment and Method: Stylet and Oral airway Placement Confirmation: ETT inserted through vocal cords under direct vision,  positive ETCO2 and breath sounds checked- equal and bilateral Secured at: 24 cm Tube secured with: Tape Dental Injury: Teeth and Oropharynx as per pre-operative assessment

## 2016-09-20 NOTE — Discharge Instructions (Addendum)

## 2016-09-20 NOTE — H&P (Signed)
Cc: Chronic nasal congestion, sinus pressure  HPI: The patient is a 31 y/o male who presents today for evaluation of chronic nasal congestion and sinus pressure. The patient has noted difficulty breathing through his nose for many years but his symptoms have gotten progressively worse. He underwent immunotherapy when he was a child. The patient notes frequent sinus pressure and pain. He last took antibiotics in the fall of 2016. The patient has tried failed treatment with steroid nasal sprays and antihistamines. He also notes that he is snoring loudly at night. No previous ENT surgery is noted.   The patient's review of systems (constitutional, eyes, ENT, cardiovascular, respiratory, GI, musculoskeletal, skin, neurologic, psychiatric, endocrine, hematologic, allergic) is noted in the ROS questionnaire.  It is reviewed with the patient.   Family health history: Diabetes, heart disease.  Major events: None.  Ongoing medical problems: None.  Social history: The patient is married. He denies the use of tobacco or illegal drugs. He drinks alcohol once a week.   Exam General: Communicates without difficulty, well nourished, no acute distress. Head: Normocephalic, no evidence injury, no tenderness, facial buttresses intact without stepoff. Eyes: PERRL, EOMI. No scleral icterus, conjunctivae clear. Neuro: CN II exam reveals vision grossly intact.  No nystagmus at any point of gaze. Ears: Auricles well formed without lesions.  Ear canals are intact without mass or lesion.  No erythema or edema is appreciated.  The TMs are intact without fluid. Nose: External evaluation reveals normal support and skin without lesions.  Dorsum is intact.  Anterior rhinoscopy reveals congested and edematous mucosa over anterior aspect of the inferior turbinates and nasal septum.  No purulence is noted. Middle meatus is not well visualized. Oral:  Oral cavity and oropharynx are intact, symmetric, without erythema or edema.  Mucosa  is moist without lesions. Neck: Full range of motion without pain.  There is no significant lymphadenopathy.  No masses palpable.  Thyroid bed within normal limits to palpation.  Parotid glands and submandibular glands equal bilaterally without mass.  Trachea is midline. Neuro:  CN 2-12 grossly intact. Gait normal. Vestibular: No nystagmus at any point of gaze.   Procedure:  Flexible Nasal Endoscopy: Risks, benefits, and alternatives of flexible endoscopy were explained to the patient.  Specific mention was made of the risk of throat numbness with difficulty swallowing, possible bleeding from the nose and mouth, and pain from the procedure.  The patient gave oral consent to proceed.  The nasal cavities were decongested and anesthetised with a combination of oxymetazoline and 4% lidocaine solution.  The flexible scope was inserted into the right nasal cavity.  Endoscopy of the inferior and middle meatus was performed.  The edematous mucosa was as described above.  No polyp, mass, or lesion was appreciated.  Olfactory cleft was clear.  Nasopharynx was clear.  Turbinates were hypertrophied but without mass.  Incomplete response to decongestion.  The procedure was repeated on the contralateral side with severe septal deviation.  The patient tolerated the procedure well.  Instructions were given to avoid eating or drinking for 2 hours.   Assessment 1.  Chronic rhinitis without evidence of acute sinusitis. No purulent drainage, polyps, or other suspicious mass or lesion is noted on today's nasal endoscopy. 2.  Severe left septal deviation with bilateral inferior turbinate hypertrophy, resulting in significant nasal obstruction.   Plan  1.  Nasal endoscopy findings are reviewed with the patient at length. 2.  Treatment options include conservative management with steroid nasal sprays versus septoplasty and  bilateral turbinate reduction. The risks, benefits, alternatives, and details of the procedure are reviewed  with the patient. Questions are invited and answered. 3.  The patient is interested in proceeding with the procedures.  We will schedule the procedure in accordance with the family schedule.

## 2016-09-20 NOTE — Op Note (Signed)
DATE OF PROCEDURE: 09/20/2016  OPERATIVE REPORT   SURGEON: Leta Baptist, MD   PREOPERATIVE DIAGNOSES:  1. Severe nasal septal deviation.  2. Bilateral inferior turbinate hypertrophy.  3. Chronic nasal obstruction.  POSTOPERATIVE DIAGNOSES:  1. Severe nasal septal deviation.  2. Bilateral inferior turbinate hypertrophy.  3. Chronic nasal obstruction. 4. Bilateral nasal polyps 5. Left concha bullosa  PROCEDURE PERFORMED:  1. Septoplasty.  2. Bilateral partial inferior turbinate resection.  3. Bilateral endoscopic nasal polypectomy 4. Left endoscopic concha bullosa resection.  ANESTHESIA: General endotracheal tube anesthesia.   COMPLICATIONS: None.   ESTIMATED BLOOD LOSS: 100 mL.   INDICATION FOR PROCEDURE: [name] is a 31 y.o. male with a history of chronic nasal obstruction. The patient was  Previously treated with antihistamine, decongestant, steroid nasal spray, and immunotherapy. However, the patient continued to be symptomatic. On examination, the patient was noted to have bilateral severe inferior turbinate hypertrophy and significant nasal septal deviation, causing near 100% nasal obstruction. Based on the above findings, the decision was made for the patient to undergo the septoplasty and turbinate reduction procedures. The risks, benefits, alternatives, and details of the procedure were discussed with the patient. Questions were invited and answered. Informed consent was obtained. Intraoperatively, the patient was noted to have significant nasal polyposis bilaterally. The polyps were noted to be posterior to the deviated portions of his septum. In addition, he was also noted to have a large left concha bullosa. The decision was therefore made to remove the nasal polyps and to resect the concha bullosa.  DESCRIPTION OF PROCEDURE: The patient was taken to the operating room and placed supine on the operating table. General endotracheal tube anesthesia was administered by the  anesthesiologist. The patient was positioned, and prepped and draped in the standard fashion for nasal surgery. Pledgets soaked with Afrin were placed in both nasal cavities for decongestion. The pledgets were subsequently removed. The above mentioned severe septal deviation was again noted. 1% lidocaine with 1:100,000 epinephrine was injected onto the nasal septum bilaterally. A hemitransfixion incision was made on the left side. The mucosal flap was carefully elevated on the left side. A cartilaginous incision was made 1 cm superior to the caudal margin of the nasal septum. Mucosal flap was also elevated on the right side in the similar fashion. It should be noted that due to the severe septal deviation, the deviated portion of the cartilaginous and bony septum had to be removed in piecemeal fashion. Once the deviated portions were removed, a straight midline septum was achieved. The septum was then quilted with 4-0 plain gut sutures. The hemitransfixion incision was closed with interrupted 4-0 chromic sutures.   After the septal deviation was fixed, the patient was noted to have significant nasal polyposis bilaterally. The polyps were emanating from the middle meatus bilaterally. Using a 0 endoscope, the polyps were visualized and removed using a combination of microdebrider, and Blakesley forceps, and Tru-Cut forceps. The polypoid tissue was sent to the pathology department for permanent histologic identification.  The left middle turbinate was also noted to be significantly enlarged. It was noted to be a concha bullosa. The left middle turbinate was resected using a combination of Tru-Cut forceps and microdebrider.  Attention was then focused on the inferior turbinates. The inferior one half of both hypertrophied inferior turbinate was crossclamped with a Kelly clamp. The inferior one half of each inferior turbinate was then resected with a pair of cross cutting scissors. Hemostasis was achieved with a  suction cautery device.  The care of the patient was turned over to the anesthesiologist. The patient was awakened from anesthesia without difficulty. The patient was extubated and transferred to the recovery room in good condition.   OPERATIVE FINDINGS: Severe nasal septal deviation and bilateral inferior turbinate hypertrophy. The patient was also noted to have bilateral nasal polyposis and left concha bullosa.  SPECIMEN: Bilateral nasal polyps.  FOLLOWUP CARE: The patient be discharged home once he is awake and alert. The patient will be placed on Percocet 1-2 tablets p.o. q.6 hours p.r.n. pain, and amoxicillin 875 mg p.o. b.i.d. for 5 days. The patient will follow up in my office in approximately 1 week for splint removal.   Giavonna Pflum Raynelle Bring, MD

## 2016-09-21 ENCOUNTER — Encounter (HOSPITAL_BASED_OUTPATIENT_CLINIC_OR_DEPARTMENT_OTHER): Payer: Self-pay | Admitting: Otolaryngology

## 2017-01-31 DIAGNOSIS — J330 Polyp of nasal cavity: Secondary | ICD-10-CM | POA: Diagnosis not present

## 2017-01-31 DIAGNOSIS — J310 Chronic rhinitis: Secondary | ICD-10-CM | POA: Diagnosis not present

## 2017-03-04 ENCOUNTER — Other Ambulatory Visit: Payer: Self-pay | Admitting: Family Medicine

## 2017-03-04 DIAGNOSIS — Z23 Encounter for immunization: Secondary | ICD-10-CM

## 2017-03-06 NOTE — Telephone Encounter (Signed)
LM that pt is due for f/u. 30 day supply sent in.

## 2017-03-08 DIAGNOSIS — Z23 Encounter for immunization: Secondary | ICD-10-CM | POA: Diagnosis not present

## 2017-04-04 ENCOUNTER — Other Ambulatory Visit: Payer: Self-pay | Admitting: Family Medicine

## 2017-04-04 DIAGNOSIS — Z23 Encounter for immunization: Secondary | ICD-10-CM

## 2017-04-05 NOTE — Telephone Encounter (Signed)
Called pt to set up an appt  , pt said that he was out of town and will call back next week to make an appt. He said that he didn't need a refill now that he had enough to last until he was able to make an appt.

## 2017-04-28 ENCOUNTER — Ambulatory Visit: Payer: 59 | Admitting: Family Medicine

## 2017-04-28 ENCOUNTER — Encounter: Payer: Self-pay | Admitting: Family Medicine

## 2017-04-28 VITALS — BP 148/110 | HR 104 | Wt 206.8 lb

## 2017-04-28 DIAGNOSIS — Z6831 Body mass index (BMI) 31.0-31.9, adult: Secondary | ICD-10-CM

## 2017-04-28 DIAGNOSIS — J301 Allergic rhinitis due to pollen: Secondary | ICD-10-CM

## 2017-04-28 DIAGNOSIS — I10 Essential (primary) hypertension: Secondary | ICD-10-CM

## 2017-04-28 DIAGNOSIS — J4530 Mild persistent asthma, uncomplicated: Secondary | ICD-10-CM

## 2017-04-28 DIAGNOSIS — E669 Obesity, unspecified: Secondary | ICD-10-CM | POA: Diagnosis not present

## 2017-04-28 MED ORDER — AMLODIPINE BESYLATE 5 MG PO TABS: 5.0000 mg | ORAL_TABLET | Freq: Every day | ORAL | 3 refills | Status: DC

## 2017-04-28 MED ORDER — EPINEPHRINE 0.3 MG/0.3ML IJ SOAJ: 0.3000 mg | Freq: Once | INTRAMUSCULAR | 1 refills | Status: AC

## 2017-04-28 MED ORDER — ALBUTEROL SULFATE HFA 108 (90 BASE) MCG/ACT IN AERS: 2.0000 | INHALATION_SPRAY | Freq: Four times a day (QID) | RESPIRATORY_TRACT | 2 refills | Status: DC | PRN

## 2017-04-28 MED ORDER — MONTELUKAST SODIUM 10 MG PO TABS: ORAL_TABLET | ORAL | 0 refills | Status: DC

## 2017-04-28 MED ORDER — FLUTICASONE-SALMETEROL 500-50 MCG/DOSE IN AEPB: INHALATION_SPRAY | RESPIRATORY_TRACT | 0 refills | Status: DC

## 2017-04-28 NOTE — Progress Notes (Signed)
   Subjective:    Patient ID: [name], male    DOB: 1986-04-01, 32 y.o.   MRN: [id number]  HPI He is here for an interval evaluation.  He does have underlying allergies and asthma and does need refills on his Advair.  He rarely uses his albuterol.  He has been seen recently by ENT and has had recent nasal surgery removing polyps.  He has still had some difficulty with infections.  His allergies also seem to be under good control but he needs a refill on his EpiPen as he does have a peanut allergy.  Nasal polyps removed.  Apparently he still has a few and there.  His weight has gone up over the last several years.  Review of his record also indicates his blood pressure has gone up.  He is married and does have a child on the way.   Review of Systems     Objective:   Physical Exam Alert and in no distress. Tympanic membranes and canals are normal. Pharyngeal area is normal. Neck is supple without adenopathy or thyromegaly. Cardiac exam shows a regular sinus rhythm without murmurs or gallops. Lungs are clear to auscultation.        Assessment & Plan:  Mild persistent chronic asthma without complication - Plan: Fluticasone-Salmeterol (ADVAIR DISKUS) 500-50 MCG/DOSE AEPB, montelukast (SINGULAIR) 10 MG tablet, albuterol (PROVENTIL HFA;VENTOLIN HFA) 108 (90 Base) MCG/ACT inhaler  Allergic rhinitis due to pollen, unspecified seasonality - Plan: EPINEPHrine 0.3 mg/0.3 mL IJ SOAJ injection  Class 1 obesity without serious comorbidity with body mass index (BMI) of 31.0 to 31.9 in adult, unspecified obesity type - Plan: CBC with Differential/Platelet, Comprehensive metabolic panel, Lipid panel  Essential hypertension - Plan: amLODipine (NORVASC) 5 MG tablet, CBC with Differential/Platelet, Comprehensive metabolic panel I will renew his allergy/asthma medications.  Discussed his weight in relation to blood pressure.  Recommend diet and exercise changes specifically 20 minutes of something  physical on a daily basis as well as cutting back on carbohydrates.  I will place him on amlodipine.  Recheck here in 1 month.

## 2017-04-29 LAB — COMPREHENSIVE METABOLIC PANEL
A/G RATIO: 2 (ref 1.2–2.2)
ALBUMIN: 5 g/dL (ref 3.5–5.5)
ALK PHOS: 108 IU/L (ref 39–117)
ALT: 140 IU/L — ABNORMAL HIGH (ref 0–44)
AST: 75 IU/L — ABNORMAL HIGH (ref 0–40)
BILIRUBIN TOTAL: 0.4 mg/dL (ref 0.0–1.2)
BUN / CREAT RATIO: 11 (ref 9–20)
BUN: 10 mg/dL (ref 6–20)
CHLORIDE: 99 mmol/L (ref 96–106)
CO2: 20 mmol/L (ref 20–29)
Calcium: 10.1 mg/dL (ref 8.7–10.2)
Creatinine, Ser: 0.91 mg/dL (ref 0.76–1.27)
GFR calc non Af Amer: 111 mL/min/{1.73_m2} (ref >59)
GFR, EST AFRICAN AMERICAN: 128 mL/min/{1.73_m2} (ref >59)
GLUCOSE: 93 mg/dL (ref 65–99)
Globulin, Total: 2.5 g/dL (ref 1.5–4.5)
POTASSIUM: 4.4 mmol/L (ref 3.5–5.2)
Sodium: 141 mmol/L (ref 134–144)
TOTAL PROTEIN: 7.5 g/dL (ref 6.0–8.5)

## 2017-04-29 LAB — CBC WITH DIFFERENTIAL/PLATELET
BASOS ABS: 0.1 10*3/uL (ref 0.0–0.2)
BASOS: 1 %
EOS (ABSOLUTE): 0.4 10*3/uL (ref 0.0–0.4)
Eos: 5 %
HEMOGLOBIN: 16.3 g/dL (ref 13.0–17.7)
Hematocrit: 47.2 % (ref 37.5–51.0)
IMMATURE GRANS (ABS): 0 10*3/uL (ref 0.0–0.1)
Immature Granulocytes: 1 %
LYMPHS ABS: 2.3 10*3/uL (ref 0.7–3.1)
LYMPHS: 26 %
MCH: 30.3 pg (ref 26.6–33.0)
MCHC: 34.5 g/dL (ref 31.5–35.7)
MCV: 88 fL (ref 79–97)
Monocytes Absolute: 0.7 10*3/uL (ref 0.1–0.9)
Monocytes: 8 %
NEUTROS ABS: 5.1 10*3/uL (ref 1.4–7.0)
Neutrophils: 59 %
PLATELETS: 297 10*3/uL (ref 150–379)
RBC: 5.38 x10E6/uL (ref 4.14–5.80)
RDW: 13.6 % (ref 12.3–15.4)
WBC: 8.7 10*3/uL (ref 3.4–10.8)

## 2017-04-29 LAB — LIPID PANEL
CHOLESTEROL TOTAL: 256 mg/dL — AB (ref 100–199)
Chol/HDL Ratio: 5.2 ratio — ABNORMAL HIGH (ref 0.0–5.0)
HDL: 49 mg/dL (ref >39)
LDL Calculated: 140 mg/dL — ABNORMAL HIGH (ref 0–99)
Triglycerides: 337 mg/dL — ABNORMAL HIGH (ref 0–149)
VLDL Cholesterol Cal: 67 mg/dL — ABNORMAL HIGH (ref 5–40)

## 2017-05-02 DIAGNOSIS — J330 Polyp of nasal cavity: Secondary | ICD-10-CM | POA: Diagnosis not present

## 2017-05-02 DIAGNOSIS — J310 Chronic rhinitis: Secondary | ICD-10-CM | POA: Diagnosis not present

## 2017-05-02 DIAGNOSIS — R0982 Postnasal drip: Secondary | ICD-10-CM | POA: Diagnosis not present

## 2017-05-25 ENCOUNTER — Other Ambulatory Visit: Payer: Self-pay | Admitting: Family Medicine

## 2017-05-25 DIAGNOSIS — J4530 Mild persistent asthma, uncomplicated: Secondary | ICD-10-CM

## 2017-05-29 ENCOUNTER — Ambulatory Visit: Payer: 59 | Admitting: Family Medicine

## 2017-05-29 ENCOUNTER — Encounter: Payer: Self-pay | Admitting: Family Medicine

## 2017-05-29 VITALS — BP 142/96 | HR 104 | Wt 204.4 lb

## 2017-05-29 DIAGNOSIS — I10 Essential (primary) hypertension: Secondary | ICD-10-CM

## 2017-05-29 MED ORDER — AMLODIPINE BESYLATE 10 MG PO TABS: 10.0000 mg | ORAL_TABLET | Freq: Every day | ORAL | 3 refills | Status: DC

## 2017-05-29 NOTE — Patient Instructions (Signed)
If you have trouble with the swelling, let me know and we will move on from there otherwise see you in a month

## 2017-05-29 NOTE — Progress Notes (Signed)
   Subjective:    Patient ID: DEMARQUES PILZ, male    DOB: 02-16-1986, 32 y.o.   MRN: [id number]  HPI He is here for recheck on his blood pressure.  He is now on Norvasc and having no difficulty.   Review of Systems     Objective:   Physical Exam Alert and in no distress.  Blood pressure is recorded.       Assessment & Plan:  Essential hypertension - Plan: amLODipine (NORVASC) 10 MG tablet I will increase him to 10 mg.  Discussed possible fluid retention and he will keep me informed.  If he does not tolerate this, I will switch him to an ACE I again discussed the benefits of diet and exercise.

## 2017-06-22 ENCOUNTER — Other Ambulatory Visit: Payer: Self-pay | Admitting: Family Medicine

## 2017-06-22 DIAGNOSIS — J4530 Mild persistent asthma, uncomplicated: Secondary | ICD-10-CM

## 2017-06-22 NOTE — Telephone Encounter (Signed)
Please advise if advir can be fill at [facility] [address]. Thanks [employer]

## 2017-06-26 ENCOUNTER — Encounter: Payer: Self-pay | Admitting: Family Medicine

## 2017-06-26 ENCOUNTER — Ambulatory Visit: Payer: 59 | Admitting: Family Medicine

## 2017-06-26 VITALS — BP 140/100 | HR 121 | Temp 99.3°F | Wt 203.2 lb

## 2017-06-26 DIAGNOSIS — J029 Acute pharyngitis, unspecified: Secondary | ICD-10-CM

## 2017-06-26 DIAGNOSIS — I10 Essential (primary) hypertension: Secondary | ICD-10-CM

## 2017-06-26 LAB — POCT RAPID STREP A (OFFICE): RAPID STREP A SCREEN: NEGATIVE

## 2017-06-26 MED ORDER — HYDROCHLOROTHIAZIDE 12.5 MG PO CAPS: 12.5000 mg | ORAL_CAPSULE | Freq: Every day | ORAL | 3 refills | Status: DC

## 2017-06-26 NOTE — Progress Notes (Signed)
   Subjective:    Patient ID: [name], male    DOB: 12-05-1985, 32 y.o.   MRN: [id number]  HPI He is here for a blood pressure recheck and he has a 3-day history of malaise, fever, chills, sore throat but no chest congestion, sneezing, coughing.  He continues on his blood pressure medication is having no difficulty with  swelling or discomfort.    Review of Systems     Objective:   Physical Exam Alert and in no distress. Tympanic membranes and canals are normal. Pharyngeal area is normal. Neck is supple without adenopathy or thyromegaly. Cardiac exam shows a regular sinus rhythm without murmurs or gallops. Lungs are clear to auscultation. Strep screen negative       Assessment & Plan:  Essential hypertension - Plan: hydrochlorothiazide (MICROZIDE) 12.5 MG capsule  Sore throat - Plan: Rapid Strep A I discussed the options concerning treating his blood pressure.  We will add HCTZ to the regimen.  He is to return here in 1 month for recheck on that. Recommend supportive care for the sore throat.  Discussed the possibility of this being flu and the fact that antiviral medicine would be ineffective at this point.

## 2017-07-18 ENCOUNTER — Other Ambulatory Visit: Payer: Self-pay | Admitting: Family Medicine

## 2017-07-18 DIAGNOSIS — J4530 Mild persistent asthma, uncomplicated: Secondary | ICD-10-CM

## 2017-08-07 ENCOUNTER — Ambulatory Visit: Payer: 59 | Admitting: Family Medicine

## 2017-08-14 ENCOUNTER — Ambulatory Visit: Payer: 59 | Admitting: Family Medicine

## 2017-08-14 ENCOUNTER — Encounter: Payer: Self-pay | Admitting: Family Medicine

## 2017-08-14 VITALS — BP 154/98 | HR 90 | Temp 98.2°F | Ht 68.0 in | Wt 210.8 lb

## 2017-08-14 DIAGNOSIS — I10 Essential (primary) hypertension: Secondary | ICD-10-CM | POA: Diagnosis not present

## 2017-08-14 NOTE — Progress Notes (Signed)
   Subjective:    Patient ID: [name], male    DOB: 10-Aug-1985, 32 y.o.   MRN: [id number]  HPI He is here for a recheck.  Unfortunately he did not take both medications thinking he was only going to switch to the new one.   Review of Systems     Objective:   Physical Exam Alert and in no distress.  Blood pressure is recorded       Assessment & Plan:  Essential hypertension I discussed taking both medications and the fact that this is going to be of control rather than cure issue and that he will be on medication the rest of his life.  Recheck here in 1 month.

## 2017-09-18 ENCOUNTER — Ambulatory Visit: Payer: 59 | Admitting: Family Medicine

## 2017-10-16 ENCOUNTER — Ambulatory Visit: Payer: 59 | Admitting: Family Medicine

## 2017-11-13 ENCOUNTER — Ambulatory Visit: Payer: 59 | Admitting: Family Medicine

## 2017-12-06 DIAGNOSIS — J310 Chronic rhinitis: Secondary | ICD-10-CM | POA: Diagnosis not present

## 2017-12-06 DIAGNOSIS — J330 Polyp of nasal cavity: Secondary | ICD-10-CM | POA: Diagnosis not present

## 2018-01-13 ENCOUNTER — Other Ambulatory Visit: Payer: Self-pay | Admitting: Family Medicine

## 2018-01-13 DIAGNOSIS — J4530 Mild persistent asthma, uncomplicated: Secondary | ICD-10-CM

## 2018-01-15 NOTE — Telephone Encounter (Signed)
[facility] is requesting to fill pt advir. Please advise. [place]

## 2018-04-09 DIAGNOSIS — H16142 Punctate keratitis, left eye: Secondary | ICD-10-CM | POA: Diagnosis not present

## 2018-07-20 ENCOUNTER — Other Ambulatory Visit: Payer: Self-pay | Admitting: Family Medicine

## 2018-07-20 DIAGNOSIS — J4530 Mild persistent asthma, uncomplicated: Secondary | ICD-10-CM

## 2018-07-23 ENCOUNTER — Telehealth: Payer: Self-pay | Admitting: Family Medicine

## 2018-07-23 NOTE — Telephone Encounter (Signed)
[facility] is requesting to fill pt advair. Please advise [facility]

## 2018-07-23 NOTE — Telephone Encounter (Signed)
  Pt called and wants refill on albuterol inhaler and also New rx for "albuterol capsules" for nebulizer machine  Pt states he is not having any symptons, just would like to have on hand with everything going on

## 2018-07-23 NOTE — Telephone Encounter (Signed)
I called him in his Advair and he has 2 refills on his albuterol so he should not have run out

## 2018-07-23 NOTE — Telephone Encounter (Signed)
Pt was advise KH 

## 2018-07-24 ENCOUNTER — Other Ambulatory Visit: Payer: Self-pay | Admitting: Family Medicine

## 2018-07-24 ENCOUNTER — Telehealth: Payer: Self-pay | Admitting: Family Medicine

## 2018-07-24 DIAGNOSIS — J4530 Mild persistent asthma, uncomplicated: Secondary | ICD-10-CM

## 2018-07-24 NOTE — Telephone Encounter (Signed)
[facility] is requesting to fill pt [facility]ol and he will also need albuterol ned solution. Thanks [employer]

## 2018-07-24 NOTE — Telephone Encounter (Signed)
Pt called back concerning refills requested yesterday. Pt rx for albuterol inhaler  has expired. The last refill we did was 04/2017. Also he needs albuterol for nebulizer. [facility] [address]. Pt can be reached at [phone].

## 2018-07-24 NOTE — Telephone Encounter (Signed)
Please advise when done so pt can be called . [name]

## 2018-07-30 ENCOUNTER — Other Ambulatory Visit: Payer: Self-pay | Admitting: Family Medicine

## 2018-07-30 DIAGNOSIS — J4530 Mild persistent asthma, uncomplicated: Secondary | ICD-10-CM

## 2018-07-30 MED ORDER — ADVAIR DISKUS 500-50 MCG/DOSE IN AEPB: INHALATION_SPRAY | RESPIRATORY_TRACT | 5 refills | Status: DC

## 2018-07-30 MED ORDER — ALBUTEROL SULFATE HFA 108 (90 BASE) MCG/ACT IN AERS: 2.0000 | INHALATION_SPRAY | Freq: Four times a day (QID) | RESPIRATORY_TRACT | 1 refills | Status: DC | PRN

## 2019-04-22 ENCOUNTER — Telehealth: Payer: Self-pay | Admitting: Family Medicine

## 2019-04-22 DIAGNOSIS — J4530 Mild persistent asthma, uncomplicated: Secondary | ICD-10-CM

## 2019-04-22 MED ORDER — ADVAIR DISKUS 500-50 MCG/DOSE IN AEPB: INHALATION_SPRAY | RESPIRATORY_TRACT | 11 refills | Status: DC

## 2019-04-22 NOTE — Telephone Encounter (Signed)
Pt called and needs refill on Advair sent to the [facility] on [address]

## 2019-07-18 ENCOUNTER — Telehealth: Payer: Self-pay | Admitting: Family Medicine

## 2019-07-18 NOTE — Telephone Encounter (Signed)
Check with the pharmacy to see if there is a generic equivalent and then prescribe it.

## 2019-07-18 NOTE — Telephone Encounter (Signed)
So the generic for the Advir is wixela or fluticasone. Pharmacist was busy and could not take a verbal. Please send in both and they will check to see which one is the most affordable for the pt. Pt was called to advise. [name]

## 2019-07-18 NOTE — Telephone Encounter (Signed)
Pt called and states that the cost of Advair has gone up. He wants to know if it is Generic. If so can re send as generic to [facility] on [address]. Please advise pt at [phone].

## 2020-01-31 ENCOUNTER — Other Ambulatory Visit: Payer: 59

## 2020-01-31 ENCOUNTER — Other Ambulatory Visit: Payer: Self-pay

## 2020-01-31 DIAGNOSIS — Z20822 Contact with and (suspected) exposure to covid-19: Secondary | ICD-10-CM

## 2020-02-01 LAB — SARS-COV-2, NAA 2 DAY TAT

## 2020-02-01 LAB — NOVEL CORONAVIRUS, NAA: SARS-CoV-2, NAA: NOT DETECTED

## 2020-02-09 ENCOUNTER — Other Ambulatory Visit: Payer: Self-pay | Admitting: Family Medicine

## 2020-02-09 DIAGNOSIS — J4530 Mild persistent asthma, uncomplicated: Secondary | ICD-10-CM

## 2020-02-10 NOTE — Telephone Encounter (Signed)
[facility] to [facility]. Please advise [facility] 

## 2020-02-19 ENCOUNTER — Telehealth: Payer: 59 | Admitting: Family Medicine

## 2020-02-19 ENCOUNTER — Other Ambulatory Visit: Payer: Self-pay

## 2020-02-19 ENCOUNTER — Encounter: Payer: Self-pay | Admitting: Family Medicine

## 2020-02-19 VITALS — BP 139/88 | Temp 98.7°F | Ht 67.0 in | Wt 197.0 lb

## 2020-02-19 DIAGNOSIS — J4541 Moderate persistent asthma with (acute) exacerbation: Secondary | ICD-10-CM | POA: Diagnosis not present

## 2020-02-19 DIAGNOSIS — R197 Diarrhea, unspecified: Secondary | ICD-10-CM

## 2020-02-19 DIAGNOSIS — U071 COVID-19: Secondary | ICD-10-CM | POA: Diagnosis not present

## 2020-02-19 DIAGNOSIS — R059 Cough, unspecified: Secondary | ICD-10-CM

## 2020-02-19 MED ORDER — ALBUTEROL SULFATE (2.5 MG/3ML) 0.083% IN NEBU: 2.5000 mg | INHALATION_SOLUTION | Freq: Four times a day (QID) | RESPIRATORY_TRACT | 0 refills | Status: DC | PRN

## 2020-02-19 MED ORDER — PREDNISONE 10 MG PO TABS: ORAL_TABLET | ORAL | 0 refills | Status: DC

## 2020-02-19 MED ORDER — HYDROCODONE-HOMATROPINE 5-1.5 MG/5ML PO SYRP: 5.0000 mL | ORAL_SOLUTION | Freq: Three times a day (TID) | ORAL | 0 refills | Status: DC | PRN

## 2020-02-19 NOTE — Patient Instructions (Signed)
Drink plenty of water. Eat a bland diet (avoiding dairy, fried/spicy foods), including bananas, rice, applesauce, toast, and broth-based soups (ie chicken noodle).  Use the albuterol nebulizer as needed for shortness of breath, continue your advair. Since you seemed a little better (per your wife, and per lack of coughing or shortness of breath with talking), try the nebulizer first and see how much better you feel, before starting the higher dose of steroids.   The prescribed course is for you to take 6 pills the first day, then 5 pills the 2nd day, and decreasing by one pill each day until you complete them (over 6 days, taking 6, 5, 4, 3, 2, 1 tablets per day).  If your shortness of breath or cough isn't improving, but your oxygen saturation remains over 90%, we can check an x-ray. If oxygen saturation is staying below 90%, you need to go to the emergency room.   COVID-19 COVID-19 is a respiratory infection that is caused by a virus called severe acute respiratory syndrome coronavirus 2 (SARS-CoV-2). The disease is also known as coronavirus disease or novel coronavirus. In some people, the virus may not cause any symptoms. In others, it may cause a serious infection. The infection can get worse quickly and can lead to complications, such as:  Pneumonia, or infection of the lungs.  Acute respiratory distress syndrome or ARDS. This is a condition in which fluid build-up in the lungs prevents the lungs from filling with air and passing oxygen into the blood.  Acute respiratory failure. This is a condition in which there is not enough oxygen passing from the lungs to the body or when carbon dioxide is not passing from the lungs out of the body.  Sepsis or septic shock. This is a serious bodily reaction to an infection.  Blood clotting problems.  Secondary infections due to bacteria or fungus.  Organ failure. This is when your body's organs stop working. The virus that causes COVID-19 is  contagious. This means that it can spread from person to person through droplets from coughs and sneezes (respiratory secretions). What are the causes? This illness is caused by a virus. You may catch the virus by:  Breathing in droplets from an infected person. Droplets can be spread by a person breathing, speaking, singing, coughing, or sneezing.  Touching something, like a table or a doorknob, that was exposed to the virus (contaminated) and then touching your mouth, nose, or eyes. What increases the risk? Risk for infection You are more likely to be infected with this virus if you:  Are within 6 feet (2 meters) of a person with COVID-19.  Provide care for or live with a person who is infected with COVID-19.  Spend time in crowded indoor spaces or live in shared housing. Risk for serious illness You are more likely to become seriously ill from the virus if you:  Are 31 years of age or older. The higher your age, the more you are at risk for serious illness.  Live in a nursing home or long-term care facility.  Have cancer.  Have a long-term (chronic) disease such as: ? Chronic lung disease, including chronic obstructive pulmonary disease or asthma. ? A long-term disease that lowers your body's ability to fight infection (immunocompromised). ? Heart disease, including heart failure, a condition in which the arteries that lead to the heart become narrow or blocked (coronary artery disease), a disease which makes the heart muscle thick, weak, or stiff (cardiomyopathy). ? Diabetes. ? Chronic  kidney disease. ? Sickle cell disease, a condition in which red blood cells have an abnormal "sickle" shape. ? Liver disease.  Are obese. What are the signs or symptoms? Symptoms of this condition can range from mild to severe. Symptoms may appear any time from 2 to 14 days after being exposed to the virus. They include:  A fever or chills.  A cough.  Difficulty breathing.  Headaches,  body aches, or muscle aches.  Runny or stuffy (congested) nose.  A sore throat.  New loss of taste or smell. Some people may also have stomach problems, such as nausea, vomiting, or diarrhea. Other people may not have any symptoms of COVID-19. How is this diagnosed? This condition may be diagnosed based on:  Your signs and symptoms, especially if: ? You live in an area with a COVID-19 outbreak. ? You recently traveled to or from an area where the virus is common. ? You provide care for or live with a person who was diagnosed with COVID-19. ? You were exposed to a person who was diagnosed with COVID-19.  A physical exam.  Lab tests, which may include: ? Taking a sample of fluid from the back of your nose and throat (nasopharyngeal fluid), your nose, or your throat using a swab. ? A sample of mucus from your lungs (sputum). ? Blood tests.  Imaging tests, which may include, X-rays, CT scan, or ultrasound. How is this treated? At present, there is no medicine to treat COVID-19. Medicines that treat other diseases are being used on a trial basis to see if they are effective against COVID-19. Your health care provider will talk with you about ways to treat your symptoms. For most people, the infection is mild and can be managed at home with rest, fluids, and over-the-counter medicines. Treatment for a serious infection usually takes places in a hospital intensive care unit (ICU). It may include one or more of the following treatments. These treatments are given until your symptoms improve.  Receiving fluids and medicines through an IV.  Supplemental oxygen. Extra oxygen is given through a tube in the nose, a face mask, or a hood.  Positioning you to lie on your stomach (prone position). This makes it easier for oxygen to get into the lungs.  Continuous positive airway pressure (CPAP) or bi-level positive airway pressure (BPAP) machine. This treatment uses mild air pressure to keep the  airways open. A tube that is connected to a motor delivers oxygen to the body.  Ventilator. This treatment moves air into and out of the lungs by using a tube that is placed in your windpipe.  Tracheostomy. This is a procedure to create a hole in the neck so that a breathing tube can be inserted.  Extracorporeal membrane oxygenation (ECMO). This procedure gives the lungs a chance to recover by taking over the functions of the heart and lungs. It supplies oxygen to the body and removes carbon dioxide. Follow these instructions at home: Lifestyle  If you are sick, stay home except to get medical care. Your health care provider will tell you how long to stay home. Call your health care provider before you go for medical care.  Rest at home as told by your health care provider.  Do not use any products that contain nicotine or tobacco, such as cigarettes, e-cigarettes, and chewing tobacco. If you need help quitting, ask your health care provider.  Return to your normal activities as told by your health care provider. Ask your health  care provider what activities are safe for you. General instructions  Take over-the-counter and prescription medicines only as told by your health care provider.  Drink enough fluid to keep your urine pale yellow.  Keep all follow-up visits as told by your health care provider. This is important. How is this prevented?  There is no vaccine to help prevent COVID-19 infection. However, there are steps you can take to protect yourself and others from this virus. To protect yourself:   Do not travel to areas where COVID-19 is a risk. The areas where COVID-19 is reported change often. To identify high-risk areas and travel restrictions, check the CDC travel website: FatFares.com.br  If you live in, or must travel to, an area where COVID-19 is a risk, take precautions to avoid infection. ? Stay away from people who are sick. ? Wash your hands often  with soap and water for 20 seconds. If soap and water are not available, use an alcohol-based hand sanitizer. ? Avoid touching your mouth, face, eyes, or nose. ? Avoid going out in public, follow guidance from your state and local health authorities. ? If you must go out in public, wear a cloth face covering or face mask. Make sure your mask covers your nose and mouth. ? Avoid crowded indoor spaces. Stay at least 6 feet (2 meters) away from others. ? Disinfect objects and surfaces that are frequently touched every day. This may include:  Counters and tables.  Doorknobs and light switches.  Sinks and faucets.  Electronics, such as phones, remote controls, keyboards, computers, and tablets. To protect others: If you have symptoms of COVID-19, take steps to prevent the virus from spreading to others.  If you think you have a COVID-19 infection, contact your health care provider right away. Tell your health care team that you think you may have a COVID-19 infection.  Stay home. Leave your house only to seek medical care. Do not use public transport.  Do not travel while you are sick.  Wash your hands often with soap and water for 20 seconds. If soap and water are not available, use alcohol-based hand sanitizer.  Stay away from other members of your household. Let healthy household members care for children and pets, if possible. If you have to care for children or pets, wash your hands often and wear a mask. If possible, stay in your own room, separate from others. Use a different bathroom.  Make sure that all people in your household wash their hands well and often.  Cough or sneeze into a tissue or your sleeve or elbow. Do not cough or sneeze into your hand or into the air.  Wear a cloth face covering or face mask. Make sure your mask covers your nose and mouth. Where to find more information  Centers for Disease Control and Prevention:  PurpleGadgets.be  World Health Organization: https://www.castaneda.info/ Contact a health care provider if:  You live in or have traveled to an area where COVID-19 is a risk and you have symptoms of the infection.  You have had contact with someone who has COVID-19 and you have symptoms of the infection. Get help right away if:  You have trouble breathing.  You have pain or pressure in your chest.  You have confusion.  You have bluish lips and fingernails.  You have difficulty waking from sleep.  You have symptoms that get worse. These symptoms may represent a serious problem that is an emergency. Do not wait to see if the  symptoms will go away. Get medical help right away. Call your local emergency services (911 in the [place] Do not drive yourself to the hospital. Let the emergency medical personnel know if you think you have COVID-19. Summary  COVID-19 is a respiratory infection that is caused by a virus. It is also known as coronavirus disease or novel coronavirus. It can cause serious infections, such as pneumonia, acute respiratory distress syndrome, acute respiratory failure, or sepsis.  The virus that causes COVID-19 is contagious. This means that it can spread from person to person through droplets from breathing, speaking, singing, coughing, or sneezing.  You are more likely to develop a serious illness if you are 44 years of age or older, have a weak immune system, live in a nursing home, or have chronic disease.  There is no medicine to treat COVID-19. Your health care provider will talk with you about ways to treat your symptoms.  Take steps to protect yourself and others from infection. Wash your hands often and disinfect objects and surfaces that are frequently touched every day. Stay away from people who are sick and wear a mask if you are sick. This information is not intended to replace advice given to you by your health care  provider. Make sure you discuss any questions you have with your health care provider. Document Revised: 01/25/2019 Document Reviewed: 05/03/2018 Elsevier Patient Education  [place].

## 2020-02-19 NOTE — Progress Notes (Signed)
Start time: 3:53 End time: 4:28  Virtual Visit via Video Note  I connected with [name] on 02/19/20 by a video enabled telemedicine application and verified that I am speaking with the correct person using two identifiers.  Location: Patient: at home, wife present Provider: office   I discussed the limitations of evaluation and management by telemedicine and the availability of in person appointments. The patient expressed understanding and agreed to proceed.  History of Present Illness:  Chief Complaint  Patient presents with  . Shortness of Breath    V[employer]UAL has positive COVID tes on 10/22 through your employer (in system but I see it as negative). Having SOB all the time, feels like he cannot take a deep breath and when he does he begins to cough.    11/1 he started with some body aches, drowsiness, malaise, decreased appetite. Fever started the night of 11/2. He had + COVID test through work 11/2.  He didn't get this + result back until 11/4. Had a Teledoc visit on 11/5, and was prescribed z-pak and prednisone (visit on Friday, got the rx and started Saturday evening). Completed 10mg  BID of prednisone and z-pak today. Had monoclonal antibody infusion today. He had not received a COVID-19 vaccine.  He states initially he was getting better, but on Sunday, 11/7 he started with worsening shortness of breath. Started out as a dry cough, then got some phlegm up (clear, yellow/green, a lot of brown). No longer getting much phlegm up, that has slowed down.  He states he can't use his albuterol inhaler, can't take a breath without coughing. He has a nebulizer at home, but no meds for it.  Pulse ox bounces between 91-92% (usual 98%)  Chart reviewed. PMH asthma, HTN (off meds, not seen in this office since 2019).  Outpatient Encounter Medications as of 02/19/2020  Medication Sig Note  . ADVAIR DISKUS 500-50 MCG/DOSE AEPB 1 puff by mouth twice per day   . albuterol (VENTOLIN  HFA) 108 (90 Base) MCG/ACT inhaler TAKE 2 PUFFS BY MOUTH EVERY 6 HOURS AS NEEDED FOR WHEEZE OR SHORTNESS OF BREATH   . azithromycin (ZITHROMAX) 250 MG tablet Take 250 mg by mouth daily. 02/19/2020: Started 11/6  . ibuprofen (ADVIL) 200 MG tablet Take 400 mg by mouth every 6 (six) hours as needed. 02/19/2020: Last dose last night  . [DISCONTINUED] predniSONE (DELTASONE) 10 MG tablet Take 10 mg by mouth 2 (two) times daily with a meal.   . albuterol (PROVENTIL) (2.5 MG/3ML) 0.083% nebulizer solution Take 3 mLs (2.5 mg total) by nebulization every 6 (six) hours as needed for wheezing or shortness of breath.   Marland Kitchen HYDROcodone-homatropine (HYCODAN) 5-1.5 MG/5ML syrup Take 5 mLs by mouth every 8 (eight) hours as needed for cough.   . predniSONE (DELTASONE) 10 MG tablet Take as directed (6/5/4/3/2/1), once daily with food.   . [DISCONTINUED] amLODipine (NORVASC) 10 MG tablet Take 1 tablet (10 mg total) by mouth daily. (Patient not taking: Reported on 08/14/2017)   . [DISCONTINUED] amoxicillin (AMOXIL) 875 MG tablet Take 1 tablet (875 mg total) by mouth 2 (two) times daily. (Patient not taking: Reported on 04/28/2017)   . [DISCONTINUED] hydrochlorothiazide (MICROZIDE) 12.5 MG capsule Take 1 capsule (12.5 mg total) by mouth daily.   . [DISCONTINUED] montelukast (SINGULAIR) 10 MG tablet TAKE 1 TABLET BY MOUTH EVERYDAY AT BEDTIME (INS MAX 30-DAY SUPPLY) (Patient not taking: Reported on 02/19/2020)   . [DISCONTINUED] oxyCODONE-acetaminophen (ROXICET) 5-325 MG tablet Take 1 tablet by mouth  every 4 (four) hours as needed for severe pain. (Patient not taking: Reported on 04/28/2017)    No facility-administered encounter medications on file as of 02/19/2020.   NOT using nebulizer or hycodan prior to today's visit. He has been using a night-time Robitussin cough medication.  Allergies  Allergen Reactions  . Peanuts [Peanut Oil] Shortness Of Breath   ROS:  Fevers resolved. Cough, SOB/DOE per HPI.  Denies any sinus  congestion, runny nose. No loss of smell or taste.  Slight headache. No nausea, vomiting. +diarrhea. Appetite improving.   Observations/Objective:  BP 139/88   Temp 98.7 F (37.1 C) (Temporal)   Ht 5\' 7"  (1.702 m)   Wt 197 lb (89.4 kg)   BMI 30.85 kg/m   Pleasant male, alert, oriented. He appears just slightly anxious. He is speaking easily, without coughing (no cough during visit at all), or seeming winded.  I had him count to 35 to see if he got winded, and he did fine. After doing this, the wife commented that this is the best she has seen him all day--he hadn't been able to talk like that earlier in the day.  He has a slight nasal sound to his voice. No throat clearing, cough, sniffling. Exam is limited due to virtual nature of the visit.  Assessment and Plan:    COVID-19 virus infection - s/p MAb infusion this morning.  Possibly noting some improvement just recently. S/sx for which he should go to ER were reviewed  Moderate persistent asthma with acute exacerbation - refill albuterol for nebulizer. Given steroid course to start if any worsening. Will contact us for CXR order if increasing SOB/sx. To ER if sats <90% - Plan: predniSONE (DELTASONE) 10 MG tablet, albuterol (PROVENTIL) (2.5 MG/3ML) 0.083% nebulizer solution  Cough - risks/SE reviewed for hydrocodone - Plan: HYDROcodone-homatropine (HYCODAN) 5-1.5 MG/5ML syrup  Diarrhea, unspecified type - related to COVID (vs ABX).  BRAT diet reviewed   60/50/40/30/20/10 prednisone course Albuterol nebulizer Hycodan syrup Brat diet   Follow Up Instructions:    I discussed the assessment and treatment plan with the patient. The patient was provided an opportunity to ask questions and all were answered. The patient agreed with the plan and demonstrated an understanding of the instructions.   The patient was advised to call back or seek an in-person evaluation if the symptoms worsen or if the condition fails to improve as  anticipated.  I provided 35 minutes of video face-to-face time during this encounter.  Additional time (5 min) was spent in chart review and documentation.   Vikki Ports, MD

## 2020-02-20 ENCOUNTER — Inpatient Hospital Stay (HOSPITAL_COMMUNITY)
Admission: EM | Admit: 2020-02-20 | Discharge: 2020-02-25 | DRG: 177 | Disposition: A | Discharge status: Home / Self Care | Payer: 59 | Attending: Family Medicine | Admitting: Family Medicine

## 2020-02-20 ENCOUNTER — Encounter (HOSPITAL_COMMUNITY): Payer: Self-pay | Admitting: Emergency Medicine

## 2020-02-20 ENCOUNTER — Emergency Department (HOSPITAL_COMMUNITY): Payer: 59

## 2020-02-20 ENCOUNTER — Telehealth: Payer: Self-pay

## 2020-02-20 ENCOUNTER — Telehealth: Payer: Self-pay | Admitting: Family Medicine

## 2020-02-20 ENCOUNTER — Other Ambulatory Visit: Payer: Self-pay

## 2020-02-20 DIAGNOSIS — J4530 Mild persistent asthma, uncomplicated: Secondary | ICD-10-CM | POA: Diagnosis not present

## 2020-02-20 DIAGNOSIS — E669 Obesity, unspecified: Secondary | ICD-10-CM | POA: Diagnosis present

## 2020-02-20 DIAGNOSIS — E871 Hypo-osmolality and hyponatremia: Secondary | ICD-10-CM | POA: Diagnosis present

## 2020-02-20 DIAGNOSIS — J45909 Unspecified asthma, uncomplicated: Secondary | ICD-10-CM | POA: Diagnosis present

## 2020-02-20 DIAGNOSIS — Z6830 Body mass index (BMI) 30.0-30.9, adult: Secondary | ICD-10-CM | POA: Diagnosis not present

## 2020-02-20 DIAGNOSIS — R059 Cough, unspecified: Secondary | ICD-10-CM

## 2020-02-20 DIAGNOSIS — U071 COVID-19: Secondary | ICD-10-CM | POA: Diagnosis present

## 2020-02-20 DIAGNOSIS — J9601 Acute respiratory failure with hypoxia: Secondary | ICD-10-CM

## 2020-02-20 DIAGNOSIS — D72810 Lymphocytopenia: Secondary | ICD-10-CM | POA: Diagnosis present

## 2020-02-20 DIAGNOSIS — J1282 Pneumonia due to coronavirus disease 2019: Secondary | ICD-10-CM | POA: Diagnosis present

## 2020-02-20 DIAGNOSIS — F419 Anxiety disorder, unspecified: Secondary | ICD-10-CM | POA: Diagnosis present

## 2020-02-20 DIAGNOSIS — Z7951 Long term (current) use of inhaled steroids: Secondary | ICD-10-CM

## 2020-02-20 DIAGNOSIS — J4541 Moderate persistent asthma with (acute) exacerbation: Secondary | ICD-10-CM

## 2020-02-20 LAB — TRIGLYCERIDES: Triglycerides: 147 mg/dL (ref <150)

## 2020-02-20 LAB — CBC WITH DIFFERENTIAL/PLATELET
Abs Immature Granulocytes: 0.04 10*3/uL (ref 0.00–0.07)
Basophils Absolute: 0 10*3/uL (ref 0.0–0.1)
Basophils Relative: 0 %
Eosinophils Absolute: 0 10*3/uL (ref 0.0–0.5)
Eosinophils Relative: 0 %
HCT: 46.2 % (ref 39.0–52.0)
Hemoglobin: 16.1 g/dL (ref 13.0–17.0)
Immature Granulocytes: 1 %
Lymphocytes Relative: 7 %
Lymphs Abs: 0.4 10*3/uL — ABNORMAL LOW (ref 0.7–4.0)
MCH: 30.7 pg (ref 26.0–34.0)
MCHC: 34.8 g/dL (ref 30.0–36.0)
MCV: 88 fL (ref 80.0–100.0)
Monocytes Absolute: 0.4 10*3/uL (ref 0.1–1.0)
Monocytes Relative: 6 %
Neutro Abs: 5.1 10*3/uL (ref 1.7–7.7)
Neutrophils Relative %: 86 %
Platelets: 251 10*3/uL (ref 150–400)
RBC: 5.25 MIL/uL (ref 4.22–5.81)
RDW: 12.5 % (ref 11.5–15.5)
WBC: 6 10*3/uL (ref 4.0–10.5)
nRBC: 0 % (ref 0.0–0.2)

## 2020-02-20 LAB — COMPREHENSIVE METABOLIC PANEL
ALT: 68 U/L — ABNORMAL HIGH (ref 0–44)
AST: 62 U/L — ABNORMAL HIGH (ref 15–41)
Albumin: 3.5 g/dL (ref 3.5–5.0)
Alkaline Phosphatase: 69 U/L (ref 38–126)
Anion gap: 15 (ref 5–15)
BUN: 10 mg/dL (ref 6–20)
CO2: 20 mmol/L — ABNORMAL LOW (ref 22–32)
Calcium: 8.6 mg/dL — ABNORMAL LOW (ref 8.9–10.3)
Chloride: 99 mmol/L (ref 98–111)
Creatinine, Ser: 0.67 mg/dL (ref 0.61–1.24)
GFR, Estimated: >60 mL/min (ref >60)
Glucose, Bld: 140 mg/dL — ABNORMAL HIGH (ref 70–99)
Potassium: 3.9 mmol/L (ref 3.5–5.1)
Sodium: 134 mmol/L — ABNORMAL LOW (ref 135–145)
Total Bilirubin: 1.3 mg/dL — ABNORMAL HIGH (ref 0.3–1.2)
Total Protein: 7.4 g/dL (ref 6.5–8.1)

## 2020-02-20 LAB — D-DIMER, QUANTITATIVE: D-Dimer, Quant: 0.62 ug/mL-FEU — ABNORMAL HIGH (ref 0.00–0.50)

## 2020-02-20 LAB — C-REACTIVE PROTEIN: CRP: 3.4 mg/dL — ABNORMAL HIGH (ref <1.0)

## 2020-02-20 LAB — LACTATE DEHYDROGENASE: LDH: 475 U/L — ABNORMAL HIGH (ref 98–192)

## 2020-02-20 LAB — FIBRINOGEN: Fibrinogen: 492 mg/dL — ABNORMAL HIGH (ref 210–475)

## 2020-02-20 LAB — RESPIRATORY PANEL BY RT PCR (FLU A&B, COVID)
Influenza A by PCR: NEGATIVE
Influenza B by PCR: NEGATIVE
SARS Coronavirus 2 by RT PCR: POSITIVE — AB

## 2020-02-20 LAB — PROCALCITONIN: Procalcitonin: 0.12 ng/mL

## 2020-02-20 LAB — FERRITIN: Ferritin: 1449 ng/mL — ABNORMAL HIGH (ref 24–336)

## 2020-02-20 LAB — LACTIC ACID, PLASMA: Lactic Acid, Venous: 1.4 mmol/L (ref 0.5–1.9)

## 2020-02-20 MED ORDER — DEXAMETHASONE SODIUM PHOSPHATE 10 MG/ML IJ SOLN
6.0000 mg | INTRAMUSCULAR | Status: DC
  Administered 2020-02-20 – 2020-02-24 (×5): 6 mg via INTRAVENOUS
  Filled 2020-02-20 (×5): qty 1

## 2020-02-20 MED ORDER — BARICITINIB 2 MG PO TABS
4.0000 mg | ORAL_TABLET | Freq: Every day | ORAL | Status: DC
  Administered 2020-02-20 – 2020-02-25 (×6): 4 mg via ORAL
  Filled 2020-02-20 (×6): qty 2

## 2020-02-20 MED ORDER — ALBUTEROL SULFATE HFA 108 (90 BASE) MCG/ACT IN AERS
2.0000 | INHALATION_SPRAY | Freq: Four times a day (QID) | RESPIRATORY_TRACT | Status: DC
  Administered 2020-02-20 – 2020-02-23 (×11): 2 via RESPIRATORY_TRACT
  Filled 2020-02-20 (×2): qty 6.7

## 2020-02-20 MED ORDER — SODIUM CHLORIDE 0.9% FLUSH
3.0000 mL | Freq: Two times a day (BID) | INTRAVENOUS | Status: DC
  Administered 2020-02-20 – 2020-02-25 (×10): 3 mL via INTRAVENOUS

## 2020-02-20 MED ORDER — ACETAMINOPHEN 325 MG PO TABS: 650.0000 mg | ORAL_TABLET | Freq: Four times a day (QID) | ORAL | Status: DC | PRN

## 2020-02-20 MED ORDER — ENOXAPARIN SODIUM 40 MG/0.4ML ~~LOC~~ SOLN
40.0000 mg | SUBCUTANEOUS | Status: DC
  Administered 2020-02-20 – 2020-02-24 (×5): 40 mg via SUBCUTANEOUS
  Filled 2020-02-20 (×5): qty 0.4

## 2020-02-20 MED ORDER — MOMETASONE FURO-FORMOTEROL FUM 100-5 MCG/ACT IN AERO
2.0000 | INHALATION_SPRAY | Freq: Two times a day (BID) | RESPIRATORY_TRACT | Status: DC
  Administered 2020-02-22 – 2020-02-25 (×7): 2 via RESPIRATORY_TRACT
  Filled 2020-02-20 (×2): qty 8.8

## 2020-02-20 MED ORDER — GUAIFENESIN-DM 100-10 MG/5ML PO SYRP
10.0000 mL | ORAL_SOLUTION | ORAL | Status: DC | PRN
  Administered 2020-02-21: 10 mL via ORAL
  Filled 2020-02-20: qty 10

## 2020-02-20 NOTE — Telephone Encounter (Signed)
Order placed, please advise he can go to either [facility] location

## 2020-02-20 NOTE — ED Provider Notes (Signed)
[facility] DEPT Provider Note   CSN: [id number] Arrival date & time: 02/20/20  1053     History Chief Complaint  Patient presents with   Covid Positive   Shortness of Breath    [name] is a 34 y.o. male.  HPI Patient presents with Covid and hypoxia.  Diagnosed on November 2 with a positive Covid test.  Done through work and does not have it with him.  Around a week before that he did have separate URI symptoms and negative Covid test at that time.  Patient had been given 10 mg of prednisone and a Z-Pak after the positive Covid test.  Does have history of asthma but rarely has to use his albuterol.  States he been feeling somewhat better but then shortness of breath got worse last couple days.  Pulse ox at home is in the mid to low 80s.  Has occasional cough but most of his respiratory symptoms besides the shortness of breath have cleared up.  No further fevers.  Patient got monoclonal antibody infusion yesterday.  Had not been vaccinated.  States he does not want remdesivir.    Past Medical History:  Diagnosis Date   Asthma    daily inhaler   Deviated nasal septum 07/2016   History of concussion    Nasal turbinate hypertrophy 07/2016    Patient Active Problem List   Diagnosis Date Noted   URI (upper respiratory infection) 05/23/2016   Chronic asthma 03/08/2016   Allergic rhinitis due to pollen 01/28/2011    Past Surgical History:  Procedure Laterality Date   CYST EXCISION Right    great toe   EXCISION CHONCHA BULLOSA Left 09/20/2016   Procedure: EXCISION CHONCHA BULLOSA;  Surgeon: Leta Baptist, MD;  Location: [facility];  Service: ENT;  Laterality: Left;   NASAL SEPTOPLASTY W/ TURBINOPLASTY Bilateral 09/20/2016   Procedure: NASAL SEPTOPLASTY WITH BILATERAL TURBINATE REDUCTION;  Surgeon: Leta Baptist, MD;  Location: [facility];  Service: ENT;  Laterality: Bilateral;   NEVUS EXCISION     age 57    POLYPECTOMY Bilateral 09/20/2016   Procedure: POLYPECTOMY NASAL;  Surgeon: Leta Baptist, MD;  Location: [facility];  Service: ENT;  Laterality: Bilateral;       Family History  Problem Relation Age of Onset   Diabetes Maternal Grandfather    Diabetes Paternal Grandfather     Social History   Tobacco Use   Smoking status: Never Smoker   Smokeless tobacco: Never Used  Vaping Use   Vaping Use: Never used  Substance Use Topics   Alcohol use: Yes    Comment: occasionally   Drug use: No    Home Medications Prior to Admission medications   Medication Sig Start Date End Date Taking? Authorizing Provider  ADVAIR DISKUS 500-50 MCG/DOSE AEPB 1 puff by mouth twice per day 04/22/19   [name], MD  albuterol (PROVENTIL) (2.5 MG/3ML) 0.083% nebulizer solution Take 3 mLs (2.5 mg total) by nebulization every 6 (six) hours as needed for wheezing or shortness of breath. 02/19/20   Rita Ohara, MD  albuterol (VENTOLIN HFA) 108 (90 Base) MCG/ACT inhaler TAKE 2 PUFFS BY MOUTH EVERY 6 HOURS AS NEEDED FOR WHEEZE OR SHORTNESS OF BREATH 02/10/20   [name], MD  azithromycin (ZITHROMAX) 250 MG tablet Take 250 mg by mouth daily. 02/15/20   [provider]  HYDROcodone-homatropine (HYCODAN) 5-1.5 MG/5ML syrup Take 5 mLs by mouth every 8 (eight) hours  as needed for cough. 02/19/20   Rita Ohara, MD  ibuprofen (ADVIL) 200 MG tablet Take 400 mg by mouth every 6 (six) hours as needed.    [provider]  predniSONE (DELTASONE) 10 MG tablet Take as directed (6/5/4/3/2/1), once daily with food. 02/19/20   Rita Ohara, MD    Allergies    Peanuts [peanut oil]  Review of Systems   Review of Systems  Constitutional: Negative for appetite change.  HENT: Negative for congestion.   Respiratory: Positive for cough and shortness of breath.   Cardiovascular: Negative for chest pain.  Gastrointestinal: Negative for abdominal distention.  Musculoskeletal: Negative for back  pain.  Skin: Negative for rash.  Neurological: Negative for weakness.  Psychiatric/Behavioral: Negative for confusion.    Physical Exam Updated Vital Signs BP (!) 139/94    Pulse (!) 112    Temp 98.2 F (36.8 C) (Oral)    Resp (!) 23    SpO2 92%   Physical Exam Vitals and nursing note reviewed.  HENT:     Head: Normocephalic.     Comments: Patient is wearing nasal cannula oxygen. Eyes:     Extraocular Movements: Extraocular movements intact.     Pupils: Pupils are equal, round, and reactive to light.  Cardiovascular:     Rate and Rhythm: Tachycardia present.  Pulmonary:     Effort: No accessory muscle usage or respiratory distress.     Comments: Rales bilateral bases. Chest:     Chest wall: No tenderness.  Abdominal:     Tenderness: There is no abdominal tenderness.  Musculoskeletal:     Right lower leg: No edema.     Left lower leg: No edema.  Skin:    General: Skin is warm.     Capillary Refill: Capillary refill takes less than 2 seconds.  Neurological:     Mental Status: He is alert and oriented to person, place, and time.     ED Results / Procedures / Treatments   Labs (all labs ordered are listed, but only abnormal results are displayed) Labs Reviewed  COMPREHENSIVE METABOLIC PANEL - Abnormal; Notable for the following components:      Result Value   Sodium 134 (*)    CO2 20 (*)    Glucose, Bld 140 (*)    Calcium 8.6 (*)    AST 62 (*)    ALT 68 (*)    Total Bilirubin 1.3 (*)    All other components within normal limits  D-DIMER, QUANTITATIVE (NOT AT [facility]) - Abnormal; Notable for the following components:   D-Dimer, Quant 0.62 (*)    All other components within normal limits  LACTATE DEHYDROGENASE - Abnormal; Notable for the following components:   LDH 475 (*)    All other components within normal limits  FERRITIN - Abnormal; Notable for the following components:   Ferritin 1,449 (*)    All other components within normal limits  FIBRINOGEN - Abnormal;  Notable for the following components:   Fibrinogen 492 (*)    All other components within normal limits  C-REACTIVE PROTEIN - Abnormal; Notable for the following components:   CRP 3.4 (*)    All other components within normal limits  RESPIRATORY PANEL BY RT PCR (FLU A&B, COVID)  CULTURE, BLOOD (ROUTINE X 2)  CULTURE, BLOOD (ROUTINE X 2)  LACTIC ACID, PLASMA  TRIGLYCERIDES  LACTIC ACID, PLASMA  CBC WITH DIFFERENTIAL/PLATELET  PROCALCITONIN    EKG EKG Interpretation  Date/Time:  Thursday February 20 2020 11:01:47  EST Ventricular Rate:  118 PR Interval:  146 QRS Duration: 94 QT Interval:  336 QTC Calculation: 470 R Axis:   51 Text Interpretation: Sinus tachycardia Otherwise normal ECG Confirmed by Davonna Belling [phone]) on 02/20/2020 11:43:23 AM   Radiology DG Chest Port 1 View  Result Date: 02/20/2020 CLINICAL DATA:  Shortness of breath. History of COVID-19 positive status EXAM: PORTABLE CHEST 1 VIEW COMPARISON:  None. FINDINGS: There is airspace opacity in each mid and lower lung region. Heart size and pulmonary vascularity are normal. No adenopathy. No bone lesions. IMPRESSION: Multifocal airspace opacity, likely due to atypical organism pneumonia. Heart size normal. No adenopathy. Electronically Signed   By: Lowella Grip III M.D.   On: 02/20/2020 13:08    Procedures Procedures (including critical care time)  Medications Ordered in ED Medications - No data to display  ED Course  I have reviewed the triage vital signs and the nursing notes.  Pertinent labs & imaging results that were available during my care of the patient were reviewed by me and considered in my medical decision making (see chart for details).    MDM Rules/Calculators/A&P                          Patient presents with COVID-19.  Reportedly diagnosed last week at work.  Hypoxic at home with sats in the mid 80s.  Doing better on nasal cannula oxygen here.  Oxygen requirement is new however.   X-ray shows multifocal pneumonia consistent with the Covid.  Patient is not vaccinated.  Patient also got monoclonal antibody infusion but states he will not want remdesivir.  With the oxygen requirement will admit to hospitalist.   Final Clinical Impression(s) / ED Diagnoses Final diagnoses:  [id number]    Rx / DC Orders ED Discharge Orders    None       Davonna Belling, MD 02/20/20 1349

## 2020-02-20 NOTE — ED Notes (Signed)
Attempted to call report X 1. 

## 2020-02-20 NOTE — ED Notes (Signed)
Date and time results received: 02/20/20 1:54 PM  (use smartphrase ".now" to insert current time)  Test: Covid  Critical Value: positive   Name of Provider Notified: Abby RN   Orders Received? Or Actions Taken?:

## 2020-02-20 NOTE — H&P (Signed)
History and Physical        Hospital Admission Note Date: 02/20/2020  Patient name: [name] Medical record number: [id number] Date of birth: Jul 12, 1985 Age: 34 y.o. Gender: male  PCP: [name], MD  Patient coming from: home   Chief Complaint    Chief Complaint  Patient presents with  . Covid Positive  . Shortness of Breath      HPI:   This is a 34 year old male with a past medical history of asthma who has not been vaccinated against COVID-19 and was diagnosed with Covid on November 2 through work and does not have his records with him. Started having URI symptoms about a week prior to that. He was treated with 10 mg of prednisone,  Z-Pak and Ivermectin. Shortness of breath worsened over the past few days and received monoclonal antibody infusion yesterday.  States he had associated diarrhea and fevers last week up to 30 F which have resolved.  No change in taste.  Non-smoker, no alcohol use.  Patient does not want remdesivir but is okay with steroids and baricitinib after risk/benefit discussion  ED Course: Afebrile, tachycardic, tachypneic, hypertensive, 88% on room air requiring 1 LPM. Notable labs: Sodium 134, AST 62, ALT 68, T bili 1.3, LDH 475, ferritin 1449, CRP 3.4, lactic acid 1.4, procalcitonin 0.12, WBC 6.0, COVID-19 positive. CXR with multifocal airspace opacities. He did not receive any medications in the ED.  Vitals:   02/20/20 1500 02/20/20 1600  BP: (!) 131/96 (!) 147/106  Pulse: (!) 107 (!) 114  Resp: (!) 25 (!) 25  Temp:    SpO2: 91% 91%     Review of Systems:  Review of Systems  All other systems reviewed and are negative.   Medical/Social/Family History   Past Medical History: Past Medical History:  Diagnosis Date  . Asthma    daily inhaler  . Deviated nasal septum 07/2016  . History of concussion   . Nasal turbinate  hypertrophy 07/2016    Past Surgical History:  Procedure Laterality Date  . CYST EXCISION Right    great toe  . EXCISION CHONCHA BULLOSA Left 09/20/2016   Procedure: EXCISION CHONCHA BULLOSA;  Surgeon: Leta Baptist, MD;  Location: [facility];  Service: ENT;  Laterality: Left;  . NASAL SEPTOPLASTY W/ TURBINOPLASTY Bilateral 09/20/2016   Procedure: NASAL SEPTOPLASTY WITH BILATERAL TURBINATE REDUCTION;  Surgeon: Leta Baptist, MD;  Location: [facility];  Service: ENT;  Laterality: Bilateral;  . NEVUS EXCISION     age 42  . POLYPECTOMY Bilateral 09/20/2016   Procedure: POLYPECTOMY NASAL;  Surgeon: Leta Baptist, MD;  Location: [facility];  Service: ENT;  Laterality: Bilateral;    Medications: Prior to Admission medications   Medication Sig Start Date End Date Taking? Authorizing Provider  ADVAIR DISKUS 500-50 MCG/DOSE AEPB 1 puff by mouth twice per day Patient taking differently: Inhale 1 puff into the lungs in the morning and at bedtime.  04/22/19  Yes [name], MD  albuterol (PROVENTIL) (2.5 MG/3ML) 0.083% nebulizer solution Take 3 mLs (2.5 mg total) by nebulization every 6 (six) hours as needed for wheezing or shortness of breath. 02/19/20  Yes Rita Ohara, MD  albuterol (VENTOLIN HFA) 108 (90 Base) MCG/ACT inhaler TAKE 2 PUFFS BY MOUTH EVERY 6 HOURS AS NEEDED FOR WHEEZE OR SHORTNESS OF BREATH Patient taking differently: Inhale 2 puffs into the lungs every 6 (six) hours as needed for wheezing or shortness of breath.  02/10/20  Yes [name], MD  Ascorbic Acid (VITAMIN C PO) Take 1 tablet by mouth in the morning and at bedtime.   Yes [provider]  benzonatate (TESSALON) 100 MG capsule Take 100 mg by mouth 3 (three) times daily as needed for cough. 02/18/20  Yes [provider]  HYDROcodone-homatropine (HYCODAN) 5-1.5 MG/5ML syrup Take 5 mLs by mouth every 8 (eight) hours as needed for cough. 02/19/20  Yes Rita Ohara, MD  ibuprofen  (ADVIL) 200 MG tablet Take 400 mg by mouth every 6 (six) hours as needed for fever, headache or mild pain.    Yes [provider]  Multiple Vitamins-Minerals (ZINC PO) Take 1 tablet by mouth in the morning and at bedtime.   Yes [provider]  predniSONE (DELTASONE) 10 MG tablet Take as directed (6/5/4/3/2/1), once daily with food. Patient taking differently: Take 10-60 mg by mouth See admin instructions. 60mg  for one day 50mg  for one day 40mg  for one day 30mg  for one day 20mg  for one day 10mg  for one day 02/19/20  Yes Rita Ohara, MD  VITAMIN D PO Take 1 capsule by mouth in the morning and at bedtime.   Yes [provider]  azithromycin (ZITHROMAX) 250 MG tablet Take 250-500 mg by mouth daily. 500mg  on day 1 250mg  day 2-5 02/15/20   [provider]  dexamethasone (DECADRON) 6 MG tablet Take 6 mg by mouth daily. 10 day supply 02/18/20   [provider]  ivermectin (STROMECTOL) 3 MG TABS tablet Take 18 mg by mouth daily. 5 day supply 02/14/20   [provider]    Allergies:   Allergies  Allergen Reactions  . Peanuts [Peanut Oil] Shortness Of Breath    Social History:  reports that he has never smoked. He has never used smokeless tobacco. He reports current alcohol use. He reports that he does not use drugs.  Family History: Family History  Problem Relation Age of Onset  . Diabetes Maternal Grandfather   . Diabetes Paternal Grandfather      Objective   Physical Exam: Blood pressure (!) 147/106, pulse (!) 114, temperature 98.2 F (36.8 C), temperature source Oral, resp. rate (!) 25, SpO2 91 %.  Physical Exam Vitals and nursing note reviewed.  Constitutional:      Appearance: Normal appearance.  HENT:     Head: Normocephalic and atraumatic.  Eyes:     Conjunctiva/sclera: Conjunctivae normal.  Cardiovascular:     Rate and Rhythm: Tachycardia present.     Pulses: Normal pulses.     Comments: Sinus tachycardia on  telemetry Pulmonary:     Effort: Pulmonary effort is normal. No tachypnea.  Abdominal:     General: Abdomen is flat.     Palpations: Abdomen is soft.  Musculoskeletal:        General: No swelling or tenderness.  Skin:    Coloration: Skin is not jaundiced or pale.  Neurological:     Mental Status: He is alert. Mental status is at baseline.  Psychiatric:        Mood and Affect: Mood normal.        Behavior: Behavior normal.     LABS on Admission: I have personally reviewed all the  labs and imaging below    Basic Metabolic Panel: Recent Labs  Lab 02/20/20 1208  NA 134*  K 3.9  CL 99  CO2 20*  GLUCOSE 140*  BUN 10  CREATININE 0.67  CALCIUM 8.6*   Liver Function Tests: Recent Labs  Lab 02/20/20 1208  AST 62*  ALT 68*  ALKPHOS 69  BILITOT 1.3*  PROT 7.4  ALBUMIN 3.5   No results for input(s): LIPASE, AMYLASE in the last 168 hours. No results for input(s): AMMONIA in the last 168 hours. CBC: Recent Labs  Lab 02/20/20 1208  WBC 6.0  NEUTROABS 5.1  HGB 16.1  HCT 46.2  MCV 88.0  PLT 251   Cardiac Enzymes: No results for input(s): CKTOTAL, CKMB, CKMBINDEX, TROPONINI in the last 168 hours. BNP: Invalid input(s): POCBNP CBG: No results for input(s): GLUCAP in the last 168 hours.  Radiological Exams on Admission:  DG Chest Port 1 View  Result Date: 02/20/2020 CLINICAL DATA:  Shortness of breath. History of COVID-19 positive status EXAM: PORTABLE CHEST 1 VIEW COMPARISON:  None. FINDINGS: There is airspace opacity in each mid and lower lung region. Heart size and pulmonary vascularity are normal. No adenopathy. No bone lesions. IMPRESSION: Multifocal airspace opacity, likely due to atypical organism pneumonia. Heart size normal. No adenopathy. Electronically Signed   By: Lowella Grip III M.D.   On: 02/20/2020 13:08      EKG: Independently reviewed.    A & P   Active Problems:   Chronic asthma   Acute hypoxemic respiratory failure due to COVID-19  ([place])   1. Acute hypoxic respiratory failure secondary to COVID-19 O2 Requirements: 0 L/min at baseline, currently 1 L/min CRP 3.4, D Dimer 0.62, Procalcitonin negative - hold off antibiotics for now Steroids x 10 days, Start Baricitinib Patient is declining remdesivir Inhalers, antitussives Encourage Incentive Spirometry, Flutter Valve and Pronation as tolerated  2. Asthma, not in overt exacerbation a. Uses Advair and as needed albuterol at home b. will switch Advair to [facility] for now   DVT prophylaxis: Lovenox   Code Status: Full Code  Diet: Regular Family Communication: Admission, patients condition and plan of care including tests being ordered have been discussed with the patient who indicates understanding and agrees with the plan and Code Status. Wife updated.  Disposition Plan: The appropriate patient status for this patient is INPATIENT. Inpatient status is judged to be reasonable and necessary in order to provide the required intensity of service to ensure the patient's safety. The patient's presenting symptoms, physical exam findings, and initial radiographic and laboratory data in the context of their chronic comorbidities is felt to place them at high risk for further clinical deterioration. Furthermore, it is not anticipated that the patient will be medically stable for discharge from the hospital within 2 midnights of admission. The following factors support the patient status of inpatient.   " The patient's presenting symptoms include shortness of breath. " The worrisome physical exam findings include hypoxia. " The initial radiographic and laboratory data are worrisome because of COVID-19 positive, mildly elevated inflammatory markers. " The chronic co-morbidities include asthma.   * I certify that at the point of admission it is my clinical judgment that the patient will require inpatient hospital care spanning beyond 2 midnights from the point of admission due to high  intensity of service, high risk for further deterioration and high frequency of surveillance required.*   Status is: Inpatient  Remains inpatient appropriate because:Inpatient level of care appropriate due to  severity of illness   Dispo: The patient is from: Home              Anticipated d/c is to: Home              Anticipated d/c date is: 2 days              Patient currently is not medically stable to d/c.       Consultants  . none  Procedures  . none  Time Spent on Admission: 64 minutes    Harold Hedge, DO [facility]  02/20/2020, 4:38 PM

## 2020-02-20 NOTE — ED Triage Notes (Signed)
Pt reports tested Covid+ last week at work. Pt having SOB and pulse ox at home that monitors O2 saturation. Home readings this morning showing O2 82-84% on room air.

## 2020-02-20 NOTE — Telephone Encounter (Signed)
Pt mother called to advise GI would not see him for chest x-ray. Pt mother also advised this am pt os stat was 83 and then went up to 90. Per Dr. Tomi Bamberger pt was advised to go to the ER. [place]

## 2020-02-20 NOTE — Telephone Encounter (Signed)
Pt's wife called. She states pt had a very bad night due to coughing. She is requesting Xray orders be placed. Pt can be reached at [phone].

## 2020-02-20 NOTE — ED Triage Notes (Signed)
Initial O2 sat here was 88% on room air. When in triage O2 reading 91% on room air. Pt placed on 2L O2 via nasal canula while in triage.

## 2020-02-20 NOTE — Progress Notes (Signed)
Patient arrives to room 1532 at this time from ED via wheelchair.  Patient inidependant to bed from chair.  Patient educated on callbell use and bed controls with stated understanding

## 2020-02-21 ENCOUNTER — Inpatient Hospital Stay (HOSPITAL_COMMUNITY): Payer: 59

## 2020-02-21 ENCOUNTER — Encounter (HOSPITAL_COMMUNITY): Payer: Self-pay | Admitting: Internal Medicine

## 2020-02-21 DIAGNOSIS — U071 COVID-19: Secondary | ICD-10-CM | POA: Diagnosis not present

## 2020-02-21 DIAGNOSIS — J9601 Acute respiratory failure with hypoxia: Secondary | ICD-10-CM | POA: Diagnosis not present

## 2020-02-21 DIAGNOSIS — J4530 Mild persistent asthma, uncomplicated: Secondary | ICD-10-CM | POA: Diagnosis not present

## 2020-02-21 LAB — COMPREHENSIVE METABOLIC PANEL
ALT: 62 U/L — ABNORMAL HIGH (ref 0–44)
AST: 37 U/L (ref 15–41)
Albumin: 3.5 g/dL (ref 3.5–5.0)
Alkaline Phosphatase: 71 U/L (ref 38–126)
Anion gap: 13 (ref 5–15)
BUN: 12 mg/dL (ref 6–20)
CO2: 24 mmol/L (ref 22–32)
Calcium: 9 mg/dL (ref 8.9–10.3)
Chloride: 99 mmol/L (ref 98–111)
Creatinine, Ser: 0.63 mg/dL (ref 0.61–1.24)
GFR, Estimated: >60 mL/min (ref >60)
Glucose, Bld: 166 mg/dL — ABNORMAL HIGH (ref 70–99)
Potassium: 4.1 mmol/L (ref 3.5–5.1)
Sodium: 136 mmol/L (ref 135–145)
Total Bilirubin: 0.8 mg/dL (ref 0.3–1.2)
Total Protein: 7.2 g/dL (ref 6.5–8.1)

## 2020-02-21 LAB — CBC WITH DIFFERENTIAL/PLATELET
Abs Immature Granulocytes: 0.06 10*3/uL (ref 0.00–0.07)
Basophils Absolute: 0 10*3/uL (ref 0.0–0.1)
Basophils Relative: 0 %
Eosinophils Absolute: 0 10*3/uL (ref 0.0–0.5)
Eosinophils Relative: 0 %
HCT: 48 % (ref 39.0–52.0)
Hemoglobin: 15.9 g/dL (ref 13.0–17.0)
Immature Granulocytes: 2 %
Lymphocytes Relative: 24 %
Lymphs Abs: 0.8 10*3/uL (ref 0.7–4.0)
MCH: 29.6 pg (ref 26.0–34.0)
MCHC: 33.1 g/dL (ref 30.0–36.0)
MCV: 89.4 fL (ref 80.0–100.0)
Monocytes Absolute: 0.5 10*3/uL (ref 0.1–1.0)
Monocytes Relative: 14 %
Neutro Abs: 2.2 10*3/uL (ref 1.7–7.7)
Neutrophils Relative %: 60 %
Platelets: 278 10*3/uL (ref 150–400)
RBC: 5.37 MIL/uL (ref 4.22–5.81)
RDW: 12.2 % (ref 11.5–15.5)
WBC: 3.5 10*3/uL — ABNORMAL LOW (ref 4.0–10.5)
nRBC: 0 % (ref 0.0–0.2)

## 2020-02-21 LAB — FERRITIN: Ferritin: 1323 ng/mL — ABNORMAL HIGH (ref 24–336)

## 2020-02-21 LAB — HIV ANTIBODY (ROUTINE TESTING W REFLEX): HIV Screen 4th Generation wRfx: NONREACTIVE

## 2020-02-21 LAB — C-REACTIVE PROTEIN: CRP: 3.1 mg/dL — ABNORMAL HIGH (ref <1.0)

## 2020-02-21 LAB — D-DIMER, QUANTITATIVE: D-Dimer, Quant: 0.46 ug/mL-FEU (ref 0.00–0.50)

## 2020-02-21 MED ORDER — MORPHINE SULFATE (PF) 2 MG/ML IV SOLN
2.0000 mg | Freq: Once | INTRAVENOUS | Status: AC
  Administered 2020-02-21: 2 mg via INTRAVENOUS
  Filled 2020-02-21: qty 1

## 2020-02-21 MED ORDER — LORAZEPAM 2 MG/ML IJ SOLN
0.5000 mg | Freq: Once | INTRAMUSCULAR | Status: AC | PRN
  Administered 2020-02-21: 0.5 mg via INTRAVENOUS
  Filled 2020-02-21: qty 1

## 2020-02-21 MED ORDER — ALPRAZOLAM 0.5 MG PO TABS
0.5000 mg | ORAL_TABLET | Freq: Three times a day (TID) | ORAL | Status: DC | PRN
  Administered 2020-02-21 – 2020-02-22 (×2): 0.5 mg via ORAL
  Filled 2020-02-21 (×2): qty 1

## 2020-02-21 MED ORDER — SODIUM CHLORIDE 0.9 % IV SOLN
200.0000 mg | Freq: Once | INTRAVENOUS | Status: AC
  Administered 2020-02-21: 200 mg via INTRAVENOUS
  Filled 2020-02-21 (×3): qty 40

## 2020-02-21 MED ORDER — IOHEXOL 350 MG/ML SOLN
100.0000 mL | Freq: Once | INTRAVENOUS | Status: AC | PRN
  Administered 2020-02-21: 100 mL via INTRAVENOUS

## 2020-02-21 MED ORDER — SODIUM CHLORIDE 0.9 % IV SOLN
100.0000 mg | Freq: Every day | INTRAVENOUS | Status: AC
  Administered 2020-02-22 – 2020-02-25 (×4): 100 mg via INTRAVENOUS
  Filled 2020-02-21 (×5): qty 20

## 2020-02-21 NOTE — Progress Notes (Signed)
PROGRESS NOTE  [name]  TKW:[id number] DOB: 1985/07/29 DOA: 02/20/2020 PCP: [name], MD   Brief Narrative: [name] is a 34 y.o. male with a history of asthma and covid-19 infection diagnosed 11/2 after developing fever, cough, sinus symptoms on 11/1, ultimately developing shortness of breath that progressed to hypoxia at home prompting ED visit 11/11. He had been taking prednisone, azithromycin, and ivermectin as an outpatient and received monoclonal antibody in [place] 11/10. In the ED he was hypoxic requiring 1L O2 with bilateral mid-lower airspace infiltrates on CXR, confirmed to be SARS-CoV-2 positive with inflammatory marker elevation (CRP 3.4, PCT 0.12, lactic acid 1.4). Remdesivir was offered but declined initially, so decadron and baricitinib were given and he was admitted for acute hypoxic respiratory failure due to covid-19 pneumonia. Overnight, hypoxia progressed significantly, though CXR remains stable. CTA chest is ordered to r/o PE.  Assessment & Plan: Active Problems:   Chronic asthma   Acute hypoxemic respiratory failure due to COVID-19 [facility])  Acute hypoxemic respiratory failure due to covid-19 pneumonia: SARS-CoV-2 initially positive on 11/2, symptoms began 11/1, s/p mAb 11/10, hypoxic on 11/12.  - Start remdesivir x5 days or until discharge. The risks and benefits were discussed with the patient who consents to initiate antiviral Tx. - Continue decadron. Relatively modest CRP elevation may only be this low due to previous steroids.  - Continue baricitinib, started 11/11, for 14 days or until hospital discharge. Monitor Cr, LFTs, differential. Keep on VTE ppx. - Encourage OOB, IS, FV, and awake proning if able - Continue airborne, contact precautions for 21 days from positive testing. - With abrupt worsening of hypoxia with stable CXR findings, no evidence of bacterial infection or CHF, CTA chest is ordered for PE r/o despite only modest d-dimer  elevation. Will continue enoxaparin prophylactic dose.   Anxiety:  - Trial low dose alprazolam prn, can also give ativan IV x1 prior to CT.  Asthma:  - Continue bronchodilators and steroids as above. No current wheezing.  LFT elevation: Due to viral infection, improving.   Lymphopenia: Improved, due to viral infection.   Hyponatremia: Resolved.  Obesity: Estimated body mass index is 30.85 kg/m as calculated from the following:   Height as of this encounter: 5\' 7"  (1.702 m).   Weight as of this encounter: 89.4 kg.  DVT prophylaxis: Lovenox Code Status: Full Family Communication: None at bedside Disposition Plan:  Status is: Inpatient  Remains inpatient appropriate because:Ongoing diagnostic testing needed not appropriate for outpatient work up and Inpatient level of care appropriate due to severity of illness  Dispo: The patient is from: Home              Anticipated d/c is to: Home              Anticipated d/c date is: > 3 days              Patient currently is not medically stable to d/c.  Consultants:   None  Procedures:   None  Antimicrobials:  Remdesivir 11/12 - 11/16   Subjective: Feels shortness of breath improved with increased oxygen administration but did get more short of breath last night, has some mild wheezing and continues to have cough exacerbated by deep breaths. Chest tightness is improved, no leg swelling. Did not report orthopnea.   Objective: Vitals:   02/21/20 0356 02/21/20 0430 02/21/20 0455 02/21/20 0758  BP:   (!) 155/110   Pulse:   (!) 106   Resp:  18   Temp:   98.4 F (36.9 C)   TempSrc:   Oral   SpO2: 93% (!) 87% 92% 90%  Weight:      Height:        Intake/Output Summary (Last 24 hours) at 02/21/2020 1326 Last data filed at 02/21/2020 1100 Gross per 24 hour  Intake 708 ml  Output 700 ml  Net 8 ml   Filed Weights   02/20/20 2119  Weight: 89.4 kg    Gen: 34 y.o. male in no distress Pulm: Non-labored tachypnea on 10L  O2 SpO2 mid-high 80%'s at rest with crackles at bases, decreased aeration throughout. No wheezes.  CV: Regular tachycardia 100-120's. No murmur, rub, or gallop. No JVD, no pitting pedal edema. GI: Abdomen soft, non-tender, non-distended, with normoactive bowel sounds. No organomegaly or masses felt. Ext: Warm, no deformities Skin: No rashes, lesions or ulcers Neuro: Alert and oriented. No focal neurological deficits. Psych: Judgement and insight appear normal. Mood & affect appropriate.   Data Reviewed: I have personally reviewed following labs and imaging studies  CBC: Recent Labs  Lab 02/20/20 1208 02/21/20 0410  WBC 6.0 3.5*  NEUTROABS 5.1 2.2  HGB 16.1 15.9  HCT 46.2 48.0  MCV 88.0 89.4  PLT 251 202   Basic Metabolic Panel: Recent Labs  Lab 02/20/20 1208 02/21/20 0410  NA 134* 136  K 3.9 4.1  CL 99 99  CO2 20* 24  GLUCOSE 140* 166*  BUN 10 12  CREATININE 0.67 0.63  CALCIUM 8.6* 9.0   GFR: Estimated Creatinine Clearance: 138.8 mL/min (by C-G formula based on SCr of 0.63 mg/dL). Liver Function Tests: Recent Labs  Lab 02/20/20 1208 02/21/20 0410  AST 62* 37  ALT 68* 62*  ALKPHOS 69 71  BILITOT 1.3* 0.8  PROT 7.4 7.2  ALBUMIN 3.5 3.5   No results for input(s): LIPASE, AMYLASE in the last 168 hours. No results for input(s): AMMONIA in the last 168 hours. Coagulation Profile: No results for input(s): INR, PROTIME in the last 168 hours. Cardiac Enzymes: No results for input(s): CKTOTAL, CKMB, CKMBINDEX, TROPONINI in the last 168 hours. BNP (last 3 results) No results for input(s): PROBNP in the last 8760 hours. HbA1C: No results for input(s): HGBA1C in the last 72 hours. CBG: No results for input(s): GLUCAP in the last 168 hours. Lipid Profile: Recent Labs    02/20/20 1208  TRIG 147   Thyroid Function Tests: No results for input(s): TSH, T4TOTAL, FREET4, T3FREE, THYROIDAB in the last 72 hours. Anemia Panel: Recent Labs    02/20/20 1129  02/21/20 0410  FERRITIN 1,449* 1,323*   Urine analysis:    Component Value Date/Time   BILIRUBINUR negative 01/21/2015 1821   KETONESUR negative 01/21/2015 1821   PROTEINUR negative 01/21/2015 1821   UROBILINOGEN 0.2 01/21/2015 1821   NITRITE Negative 01/21/2015 1821   LEUKOCYTESUR Negative 01/21/2015 1821   Recent Results (from the past 240 hour(s))  Respiratory Panel by RT PCR (Flu A&B, Covid) - Nasopharyngeal Swab     Status: Abnormal   Collection Time: 02/20/20 12:08 PM   Specimen: Nasopharyngeal Swab  Result Value Ref Range Status   SARS Coronavirus 2 by RT PCR POSITIVE (A) NEGATIVE Final    Comment: RESULT CALLED TO, READ BACK BY AND VERIFIED WITH: HALL,C. RN @1354  ON 11.11.2021 BY COHEN,K (NOTE) SARS-CoV-2 target nucleic acids are DETECTED.  SARS-CoV-2 RNA is generally detectable in upper respiratory specimens  during the acute phase of infection. Positive results are indicative of  the presence of the identified virus, but do not rule out bacterial infection or co-infection with other pathogens not detected by the test. Clinical correlation with patient history and other diagnostic information is necessary to determine patient infection status. The expected result is Negative.  Fact Sheet for Patients:  PinkCheek.be  Fact Sheet for Healthcare Providers: GravelBags.it  This test is not yet approved or cleared by the [place] FDA and  has been authorized for detection and/or diagnosis of SARS-CoV-2 by FDA under an Emergency Use Authorization (EUA).  This EUA will remain in effect (meaning this test  can be used) for the duration of  the COVID-19 declaration under Section 564(b)(1) of the Act, 21 U.S.C. section 360bbb-3(b)(1), unless the authorization is terminated or revoked sooner.      Influenza A by PCR NEGATIVE NEGATIVE Final   Influenza B by PCR NEGATIVE NEGATIVE Final    Comment: (NOTE) The  Xpert Xpress SARS-CoV-2/FLU/RSV assay is intended as an aid in  the diagnosis of influenza from Nasopharyngeal swab specimens and  should not be used as a sole basis for treatment. Nasal washings and  aspirates are unacceptable for Xpert Xpress SARS-CoV-2/FLU/RSV  testing.  Fact Sheet for Patients: PinkCheek.be  Fact Sheet for Healthcare Providers: GravelBags.it  This test is not yet approved or cleared by the [place] FDA and  has been authorized for detection and/or diagnosis of SARS-CoV-2 by  FDA under an Emergency Use Authorization (EUA). This EUA will remain  in effect (meaning this test can be used) for the duration of the  Covid-19 declaration under Section 564(b)(1) of the Act, 21  U.S.C. section 360bbb-3(b)(1), unless the authorization is  terminated or revoked. Performed at [facility], [place] [address], [place], [place] [address]   Blood Culture (routine x 2)     Status: None (Preliminary result)   Collection Time: 02/20/20 12:08 PM   Specimen: Site Not Specified; Blood  Result Value Ref Range Status   Specimen Description   Final    SITE NOT SPECIFIED Performed at [facility] [address], [place], [place] [address]    Special Requests   Final    BOTTLES DRAWN AEROBIC AND ANAEROBIC Blood Culture results may not be optimal due to an inadequate volume of blood received in culture bottles Performed at [facility] [address], [place], [place] [address]    Culture   Final    NO GROWTH < 24 HOURS Performed at [facility] [address] [place], [place] [address]    Report Status PENDING  Incomplete  Blood Culture (routine x 2)     Status: None (Preliminary result)   Collection Time: 02/20/20 12:30 PM   Specimen: BLOOD LEFT FOREARM  Result Value Ref Range Status   Specimen Description   Final    BLOOD LEFT FOREARM Performed at  [facility] [address], [place], [place] [address]    Special Requests   Final    BOTTLES DRAWN AEROBIC AND ANAEROBIC Blood Culture adequate volume Performed at [facility] [address], [place], [place] [address]    Culture   Final    NO GROWTH < 24 HOURS Performed at [facility] [address] [place], [place] [address]    Report Status PENDING  Incomplete      Radiology Studies: DG CHEST PORT 1 VIEW  Result Date: 02/21/2020 CLINICAL DATA:  COVID-19 pneumonia EXAM: PORTABLE CHEST  1 VIEW COMPARISON:  02/20/2020 FINDINGS: Cardiac shadow is stable. Patchy opacities are noted bilaterally consistent with the given clinical history. The overall appearance is stable from the previous day. No bony abnormality is noted. IMPRESSION: Stable opacities bilaterally consistent with COVID-19 pneumonia. Electronically Signed   By: Inez Catalina M.D.   On: 02/21/2020 08:37   DG Chest Port 1 View  Result Date: 02/20/2020 CLINICAL DATA:  Shortness of breath. History of COVID-19 positive status EXAM: PORTABLE CHEST 1 VIEW COMPARISON:  None. FINDINGS: There is airspace opacity in each mid and lower lung region. Heart size and pulmonary vascularity are normal. No adenopathy. No bone lesions. IMPRESSION: Multifocal airspace opacity, likely due to atypical organism pneumonia. Heart size normal. No adenopathy. Electronically Signed   By: Lowella Grip III M.D.   On: 02/20/2020 13:08    Scheduled Meds: . albuterol  2 puff Inhalation Q6H  . baricitinib  4 mg Oral Daily  . dexamethasone (DECADRON) injection  6 mg Intravenous Q24H  . enoxaparin (LOVENOX) injection  40 mg Subcutaneous Q24H  . mometasone-formoterol  2 puff Inhalation BID  . sodium chloride flush  3 mL Intravenous Q12H   Continuous Infusions:   LOS: 1 day   Time spent: 35 minutes.  Patrecia Pour, MD [facility] [email or web address] 02/21/2020, 1:26 PM

## 2020-02-22 DIAGNOSIS — U071 COVID-19: Secondary | ICD-10-CM | POA: Diagnosis not present

## 2020-02-22 DIAGNOSIS — J4530 Mild persistent asthma, uncomplicated: Secondary | ICD-10-CM | POA: Diagnosis not present

## 2020-02-22 DIAGNOSIS — J9601 Acute respiratory failure with hypoxia: Secondary | ICD-10-CM | POA: Diagnosis not present

## 2020-02-22 LAB — COMPREHENSIVE METABOLIC PANEL
ALT: 61 U/L — ABNORMAL HIGH (ref 0–44)
AST: 32 U/L (ref 15–41)
Albumin: 3.6 g/dL (ref 3.5–5.0)
Alkaline Phosphatase: 62 U/L (ref 38–126)
Anion gap: 11 (ref 5–15)
BUN: 20 mg/dL (ref 6–20)
CO2: 29 mmol/L (ref 22–32)
Calcium: 9.3 mg/dL (ref 8.9–10.3)
Chloride: 98 mmol/L (ref 98–111)
Creatinine, Ser: 0.83 mg/dL (ref 0.61–1.24)
GFR, Estimated: >60 mL/min (ref >60)
Glucose, Bld: 144 mg/dL — ABNORMAL HIGH (ref 70–99)
Potassium: 4.2 mmol/L (ref 3.5–5.1)
Sodium: 138 mmol/L (ref 135–145)
Total Bilirubin: 0.9 mg/dL (ref 0.3–1.2)
Total Protein: 7.5 g/dL (ref 6.5–8.1)

## 2020-02-22 LAB — C-REACTIVE PROTEIN: CRP: 0.9 mg/dL (ref <1.0)

## 2020-02-22 LAB — D-DIMER, QUANTITATIVE: D-Dimer, Quant: 0.36 ug/mL-FEU (ref 0.00–0.50)

## 2020-02-22 MED ORDER — SALINE SPRAY 0.65 % NA SOLN
1.0000 | NASAL | Status: DC | PRN
  Filled 2020-02-22: qty 44

## 2020-02-22 MED ORDER — OXYMETAZOLINE HCL 0.05 % NA SOLN
1.0000 | Freq: Two times a day (BID) | NASAL | Status: DC
  Administered 2020-02-22 – 2020-02-25 (×5): 1 via NASAL
  Filled 2020-02-22: qty 15

## 2020-02-22 NOTE — Progress Notes (Addendum)
Pt. Was encouraged OOB and ambulate inside the room. Pt. Is on 5L nasal canula able to tolerate ambulation, O2 saturation is 88-91% during ambulation. Pt. resting in the chair with at 92%

## 2020-02-22 NOTE — Progress Notes (Signed)
PROGRESS NOTE  [name]  BSJ:[id number] DOB: 1985/04/16 DOA: 02/20/2020 PCP: [name], [name]   Brief Narrative: [name] is a 34 y.o. male with a history of asthma and covid-19 infection diagnosed 11/2 after developing fever, cough, sinus symptoms on 11/1, ultimately developing shortness of breath that progressed to hypoxia at home prompting ED visit 11/11. He had been taking prednisone, azithromycin, and ivermectin as an outpatient and received monoclonal antibody in [place] 11/10. In the ED he was hypoxic requiring 1L O2 with bilateral mid-lower airspace infiltrates on CXR, confirmed to be SARS-CoV-2 positive with inflammatory marker elevation (CRP 3.4, PCT 0.12, lactic acid 1.4). Remdesivir was offered but declined initially, so decadron and baricitinib were given and he was admitted for acute hypoxic respiratory failure due to covid-19 pneumonia. Overnight, hypoxia progressed significantly, though CXR remains stable.    Assessment & Plan: Active Problems:   Chronic asthma   Acute hypoxemic respiratory failure due to COVID-19 [facility])  Acute hypoxemic respiratory failure due to covid-19 pneumonia: SARS-CoV-2 initially positive on 11/2, symptoms began 11/1, s/p mAb 11/10, hypoxic on 11/12.  - Start remdesivir x5 days - Continue decadron. Relatively modest CRP elevation may only be this low due to previous steroids.  - Continue baricitinib, started 11/11, for 14 days or until hospital discharge. Monitor Cr, LFTs, differential. Keep on VTE ppx. - Encourage OOB, IS, FV, and awake proning if able - Continue airborne, contact precautions for 21 days from positive testing. - CTA chest personally reviewed showing diffuse airspace opacities with no definite PE.  - Nasal saline, trial afrin for congestion.  Anxiety:  - Trial low dose alprazolam prn  Asthma:  - Continue bronchodilators and steroids as above. No current wheezing.  LFT elevation: Due to viral infection, improving.     Lymphopenia: Improved, due to viral infection.   Hyponatremia: Resolved.  Obesity: Estimated body mass index is 30.85 kg/m as calculated from the following:   Height as of this encounter: 5\' 7"  (1.702 m).   Weight as of this encounter: 89.4 kg.  DVT prophylaxis: Lovenox Code Status: Full Family Communication: Wife by phone, will call this PM. Disposition Plan:  Status is: Inpatient  Remains inpatient appropriate because:Ongoing diagnostic testing needed not appropriate for outpatient work up and Inpatient level of care appropriate due to severity of illness  Dispo: The patient is from: Home              Anticipated d/c is to: Home              Anticipated d/c date is: > 3 days              Patient currently is not medically stable to d/c.  Consultants:   None  Procedures:   None  Antimicrobials:  Remdesivir 11/12 - 11/16   Subjective: Feeling overall slightly better, still short of breath intermittently, moderately. Oxygen needs came down over past 24 hours. No chest pain or leg swelling.   Objective: Vitals:   02/21/20 1410 02/21/20 2012 02/22/20 0447 02/22/20 1343  BP: (!) 131/101 (!) 138/107 (!) 136/96 (!) 139/99  Pulse: 96 97 90 98  Resp: 20 20 (!) 21 16  Temp: 98.4 F (36.9 C) 98.3 F (36.8 C) 98.1 F (36.7 C) 97.6 F (36.4 C)  TempSrc:  Oral Oral   SpO2: 97% 96% 92% (!) 89%  Weight:      Height:        Intake/Output Summary (Last 24 hours) at 02/22/2020  [place] filed at 02/22/2020 1500 Gross per 24 hour  Intake 601.84 ml  Output 1350 ml  Net -748.16 ml   Filed Weights   02/20/20 2119  Weight: 89.4 kg   Gen: 34 y.o. male in no distress Pulm: Nonlabored breathing supplemental oxygen, tachypneic. Crackles without wheezes. CV: Regular rate and rhythm. No murmur, rub, or gallop. No JVD, no dependent edema. GI: Abdomen soft, non-tender, non-distended, with normoactive bowel sounds.  Ext: Warm, no deformities Skin: No rashes, lesions or  ulcers on visualized skin. Neuro: Alert and oriented. No focal neurological deficits. Psych: Judgement and insight appear fair. Mood euthymic & affect congruent. Behavior is appropriate.    Data Reviewed: I have personally reviewed following labs and imaging studies  CBC: Recent Labs  Lab 02/20/20 1208 02/21/20 0410  WBC 6.0 3.5*  NEUTROABS 5.1 2.2  HGB 16.1 15.9  HCT 46.2 48.0  MCV 88.0 89.4  PLT 251 347   Basic Metabolic Panel: Recent Labs  Lab 02/20/20 1208 02/21/20 0410 02/22/20 0503  NA 134* 136 138  K 3.9 4.1 4.2  CL 99 99 98  CO2 20* 24 29  GLUCOSE 140* 166* 144*  BUN 10 12 20   CREATININE 0.67 0.63 0.83  CALCIUM 8.6* 9.0 9.3   GFR: Estimated Creatinine Clearance: 133.7 mL/min (by C-G formula based on SCr of 0.83 mg/dL). Liver Function Tests: Recent Labs  Lab 02/20/20 1208 02/21/20 0410 02/22/20 0503  AST 62* 37 32  ALT 68* 62* 61*  ALKPHOS 69 71 62  BILITOT 1.3* 0.8 0.9  PROT 7.4 7.2 7.5  ALBUMIN 3.5 3.5 3.6   No results for input(s): LIPASE, AMYLASE in the last 168 hours. No results for input(s): AMMONIA in the last 168 hours. Coagulation Profile: No results for input(s): INR, PROTIME in the last 168 hours. Cardiac Enzymes: No results for input(s): CKTOTAL, CKMB, CKMBINDEX, TROPONINI in the last 168 hours. BNP (last 3 results) No results for input(s): PROBNP in the last 8760 hours. HbA1C: No results for input(s): HGBA1C in the last 72 hours. CBG: No results for input(s): GLUCAP in the last 168 hours. Lipid Profile: Recent Labs    02/20/20 1208  TRIG 147   Thyroid Function Tests: No results for input(s): TSH, T4TOTAL, FREET4, T3FREE, THYROIDAB in the last 72 hours. Anemia Panel: Recent Labs    02/20/20 1129 02/21/20 0410  FERRITIN 1,449* 1,323*   Urine analysis:    Component Value Date/Time   BILIRUBINUR negative 01/21/2015 1821   KETONESUR negative 01/21/2015 1821   PROTEINUR negative 01/21/2015 1821   UROBILINOGEN 0.2  01/21/2015 1821   NITRITE Negative 01/21/2015 1821   LEUKOCYTESUR Negative 01/21/2015 1821   Recent Results (from the past 240 hour(s))  Respiratory Panel by RT PCR (Flu A&B, Covid) - Nasopharyngeal Swab     Status: Abnormal   Collection Time: 02/20/20 12:08 PM   Specimen: Nasopharyngeal Swab  Result Value Ref Range Status   SARS Coronavirus 2 by RT PCR POSITIVE (A) NEGATIVE Final    Comment: RESULT CALLED TO, READ BACK BY AND VERIFIED WITH: HALL,C. RN @1354  ON 11.11.2021 BY COHEN,K (NOTE) SARS-CoV-2 target nucleic acids are DETECTED.  SARS-CoV-2 RNA is generally detectable in upper respiratory specimens  during the acute phase of infection. Positive results are indicative of the presence of the identified virus, but do not rule out bacterial infection or co-infection with other pathogens not detected by the test. Clinical correlation with patient history and other diagnostic information is necessary to determine patient  infection status. The expected result is Negative.  Fact Sheet for Patients:  PinkCheek.be  Fact Sheet for Healthcare Providers: GravelBags.it  This test is not yet approved or cleared by the [place] FDA and  has been authorized for detection and/or diagnosis of SARS-CoV-2 by FDA under an Emergency Use Authorization (EUA).  This EUA will remain in effect (meaning this test  can be used) for the duration of  the COVID-19 declaration under Section 564(b)(1) of the Act, 21 U.S.C. section 360bbb-3(b)(1), unless the authorization is terminated or revoked sooner.      Influenza A by PCR NEGATIVE NEGATIVE Final   Influenza B by PCR NEGATIVE NEGATIVE Final    Comment: (NOTE) The Xpert Xpress SARS-CoV-2/FLU/RSV assay is intended as an aid in  the diagnosis of influenza from Nasopharyngeal swab specimens and  should not be used as a sole basis for treatment. Nasal washings and  aspirates are  unacceptable for Xpert Xpress SARS-CoV-2/FLU/RSV  testing.  Fact Sheet for Patients: PinkCheek.be  Fact Sheet for Healthcare Providers: GravelBags.it  This test is not yet approved or cleared by the [place] FDA and  has been authorized for detection and/or diagnosis of SARS-CoV-2 by  FDA under an Emergency Use Authorization (EUA). This EUA will remain  in effect (meaning this test can be used) for the duration of the  Covid-19 declaration under Section 564(b)(1) of the Act, 21  U.S.C. section 360bbb-3(b)(1), unless the authorization is  terminated or revoked. Performed at [facility], [place] [address] [place], [place] [address]   Blood Culture (routine x 2)     Status: None (Preliminary result)   Collection Time: 02/20/20 12:08 PM   Specimen: Site Not Specified; Blood  Result Value Ref Range Status   Specimen Description   Final    SITE NOT SPECIFIED Performed at [facility] [address] [place], [place] [address]    Special Requests   Final    BOTTLES DRAWN AEROBIC AND ANAEROBIC Blood Culture results may not be optimal due to an inadequate volume of blood received in culture bottles Performed at [facility] [address], [place], [place] [address]    Culture   Final    NO GROWTH 2 DAYS Performed at [facility] [address] [place], [place] [address]    Report Status PENDING  Incomplete  Blood Culture (routine x 2)     Status: None (Preliminary result)   Collection Time: 02/20/20 12:30 PM   Specimen: BLOOD LEFT FOREARM  Result Value Ref Range Status   Specimen Description   Final    BLOOD LEFT FOREARM Performed at [facility] [address] [place], [place] [address]    Special Requests   Final    BOTTLES DRAWN AEROBIC AND ANAEROBIC Blood Culture adequate volume Performed at [facility] [address], [place], [place] [address]    Culture   Final    NO GROWTH 2 DAYS Performed at [facility] [address] [place], [place] [address]    Report Status PENDING  Incomplete      Radiology Studies: CT ANGIO CHEST PE W OR WO CONTRAST  Result Date: 02/21/2020 CLINICAL DATA:  34 year old male with shortness of breath. Positive COVID-19. EXAM: CT ANGIOGRAPHY CHEST WITH CONTRAST TECHNIQUE: Multidetector CT imaging of the chest was performed using the standard protocol during bolus administration of intravenous contrast. Multiplanar CT image reconstructions and MIPs were obtained  to evaluate the vascular anatomy. CONTRAST:  142mL OMNIPAQUE IOHEXOL 350 MG/ML SOLN COMPARISON:  Portable chest 0815 hours today. FINDINGS: Cardiovascular: Adequate contrast bolus timing in the pulmonary arterial tree. Respiratory motion. No central or hilar pulmonary embolus. Other pulmonary artery branch detail is degraded. No cardiomegaly or pericardial effusion. Negative visible aorta. Mediastinum/Nodes: Mildly reactive appearing bilateral hilar lymph nodes. Lungs/Pleura: Left greater than right lower lobe consolidation with widespread superimposed bilateral upper lobe peribronchial and peripheral pulmonary ground-glass opacity. Similar involvement in the lingula. The right middle lobe is relatively spared. No pleural effusion. Upper Abdomen: Hepatic steatosis. Negative visible spleen, right adrenal gland and bowel in the upper abdomen. Musculoskeletal: No acute osseous abnormality identified. Review of the MIP images confirms the above findings. IMPRESSION: 1. No central or hilar pulmonary embolus. Other pulmonary artery branch detail is degraded by motion. 2. Bilateral COVID-19 pneumonia with lower lobe consolidation. No pleural effusion. 3. Hepatic steatosis. Electronically Signed   By: Genevie Ann M.D.   On: 02/21/2020 21:26   DG CHEST PORT 1 VIEW  Result Date: 02/21/2020 CLINICAL DATA:  COVID-19 pneumonia  EXAM: PORTABLE CHEST 1 VIEW COMPARISON:  02/20/2020 FINDINGS: Cardiac shadow is stable. Patchy opacities are noted bilaterally consistent with the given clinical history. The overall appearance is stable from the previous day. No bony abnormality is noted. IMPRESSION: Stable opacities bilaterally consistent with COVID-19 pneumonia. Electronically Signed   By: Inez Catalina M.D.   On: 02/21/2020 08:37    Scheduled Meds: . albuterol  2 puff Inhalation Q6H  . baricitinib  4 mg Oral Daily  . dexamethasone (DECADRON) injection  6 mg Intravenous Q24H  . enoxaparin (LOVENOX) injection  40 mg Subcutaneous Q24H  . mometasone-formoterol  2 puff Inhalation BID  . oxymetazoline  1 spray Each Nare BID  . sodium chloride flush  3 mL Intravenous Q12H   Continuous Infusions: . remdesivir 100 mg in NS 100 mL 100 mg (02/22/20 0916)     LOS: 2 days   Time spent: 35 minutes.  Patrecia Pour, [name] [facility] [email or web address] 02/22/2020, 4:45 PM

## 2020-02-23 DIAGNOSIS — J4530 Mild persistent asthma, uncomplicated: Secondary | ICD-10-CM | POA: Diagnosis not present

## 2020-02-23 DIAGNOSIS — J9601 Acute respiratory failure with hypoxia: Secondary | ICD-10-CM | POA: Diagnosis not present

## 2020-02-23 DIAGNOSIS — U071 COVID-19: Secondary | ICD-10-CM | POA: Diagnosis not present

## 2020-02-23 LAB — COMPREHENSIVE METABOLIC PANEL
ALT: 57 U/L — ABNORMAL HIGH (ref 0–44)
AST: 27 U/L (ref 15–41)
Albumin: 3.1 g/dL — ABNORMAL LOW (ref 3.5–5.0)
Alkaline Phosphatase: 54 U/L (ref 38–126)
Anion gap: 10 (ref 5–15)
BUN: 20 mg/dL (ref 6–20)
CO2: 25 mmol/L (ref 22–32)
Calcium: 8.7 mg/dL — ABNORMAL LOW (ref 8.9–10.3)
Chloride: 102 mmol/L (ref 98–111)
Creatinine, Ser: 0.71 mg/dL (ref 0.61–1.24)
GFR, Estimated: >60 mL/min (ref >60)
Glucose, Bld: 165 mg/dL — ABNORMAL HIGH (ref 70–99)
Potassium: 4.6 mmol/L (ref 3.5–5.1)
Sodium: 137 mmol/L (ref 135–145)
Total Bilirubin: 0.7 mg/dL (ref 0.3–1.2)
Total Protein: 6.4 g/dL — ABNORMAL LOW (ref 6.5–8.1)

## 2020-02-23 LAB — C-REACTIVE PROTEIN: CRP: 0.5 mg/dL (ref <1.0)

## 2020-02-23 LAB — D-DIMER, QUANTITATIVE: D-Dimer, Quant: 0.33 ug/mL-FEU (ref 0.00–0.50)

## 2020-02-23 MED ORDER — ALBUTEROL SULFATE HFA 108 (90 BASE) MCG/ACT IN AERS: 2.0000 | INHALATION_SPRAY | Freq: Four times a day (QID) | RESPIRATORY_TRACT | Status: DC | PRN

## 2020-02-23 NOTE — Progress Notes (Signed)
Patient ambulated in around his room for about 5 mins probably 30 ft total. He tolerated well. O2 remained 90-92% on 5L HFNC. Patient aware to keep walking. Will continue to monitor.

## 2020-02-23 NOTE — Progress Notes (Signed)
PROGRESS NOTE  [name]  ZHG:[id number] DOB: [birth year] DOA: 02/20/2020 PCP: [name], MD   Brief Narrative: [name] is a 34 y.o. male with a history of asthma and covid-19 infection diagnosed 11/2 after developing fever, cough, sinus symptoms on 11/1, ultimately developing shortness of breath that progressed to hypoxia at home prompting ED visit 11/11. He had been taking prednisone, azithromycin, and ivermectin as an outpatient and received monoclonal antibody in [place] 11/10. In the ED he was hypoxic requiring 1L O2 with bilateral mid-lower airspace infiltrates on CXR, confirmed to be SARS-CoV-2 positive with inflammatory marker elevation (CRP 3.4, PCT 0.12, lactic acid 1.4). Remdesivir was offered but declined initially, so decadron and baricitinib were given and he was admitted for acute hypoxic respiratory failure due to covid-19 pneumonia. Overnight, hypoxia progressed significantly, though CXR remains stable.    Assessment & Plan: Active Problems:   Chronic asthma   Acute hypoxemic respiratory failure due to COVID-19 [facility])  Acute hypoxemic respiratory failure due to covid-19 pneumonia: SARS-CoV-2 initially positive on 11/2, symptoms began 11/1, s/p mAb 11/10, hypoxic on 11/12.  - Continue remdesivir x5 days - Continue decadron. Relatively modest CRP elevation may only be this low due to previous steroids. CRP normalized starting 11/13. - Continue baricitinib, started 11/11, for 14 days or until hospital discharge. Monitor Cr, LFTs, differential. Keep on VTE ppx. - Encouraged OOB, IS - Continue airborne, contact precautions for 21 days from positive testing. - Nasal saline, trial afrin for congestion.  Anxiety:  - Trial low dose alprazolam prn  Asthma:  - Continue bronchodilators and steroids as above. No current wheezing.  LFT elevation: Due to viral infection, improving significantly.  Lymphopenia: Improved, due to viral infection.   Hyponatremia:  Resolved.  Obesity: Estimated body mass index is 30.85 kg/m as calculated from the following:   Height as of this encounter: 5\' 7"  (1.702 m).   Weight as of this encounter: 89.4 kg.  DVT prophylaxis: Lovenox, DD nl Code Status: Full Family Communication: Wife by phone at time of encounter. Disposition Plan:  Status is: Inpatient  Remains inpatient appropriate because:Ongoing diagnostic testing needed not appropriate for outpatient work up and Inpatient level of care appropriate due to severity of illness  Dispo: The patient is from: Home              Anticipated d/c is to: Home              Anticipated d/c date is: 2 days              Patient currently is not medically stable to d/c.  Consultants:   None  Procedures:   None  Antimicrobials:  Remdesivir 11/12 - 11/16   Subjective: Overall improved with dyspnea. No wheezing, slept poorly.   Objective: Vitals:   02/22/20 2017 02/23/20 0415 02/23/20 0649 02/23/20 1321  BP: (!) 137/92 (!) 150/106 (!) 141/106 (!) 135/93  Pulse: 95 79 79 99  Resp: (!) 21 16  16   Temp: 97.9 F (36.6 C) 97.8 F (36.6 C)  97.9 F (36.6 C)  TempSrc: Oral Oral  Oral  SpO2: 95% 97%  97%  Weight:      Height:        Intake/Output Summary (Last 24 hours) at 02/23/2020 1545 Last data filed at 02/23/2020 0900 Gross per 24 hour  Intake 480 ml  Output --  Net 480 ml   Filed Weights   02/20/20 2119  Weight: 89.4 kg   Gen:  34 y.o. male in no distress Pulm: Nonlabored breathing 5L, SpO2 88-90%, crackles stable.  CV: Regular rate and rhythm. No murmur, rub, or gallop. No JVD, no dependent edema. GI: Abdomen soft, non-tender, non-distended, with normoactive bowel sounds.  Ext: Warm, no deformities Skin: No rashes, lesions or ulcers on visualized skin. Neuro: Alert and oriented. No focal neurological deficits. Psych: Judgement and insight appear fair. Mood euthymic & affect congruent. Behavior is appropriate.    Data Reviewed: I have  personally reviewed following labs and imaging studies  CBC: Recent Labs  Lab 02/20/20 1208 02/21/20 0410  WBC 6.0 3.5*  NEUTROABS 5.1 2.2  HGB 16.1 15.9  HCT 46.2 48.0  MCV 88.0 89.4  PLT 251 564   Basic Metabolic Panel: Recent Labs  Lab 02/20/20 1208 02/21/20 0410 02/22/20 0503 02/23/20 0402  NA 134* 136 138 137  K 3.9 4.1 4.2 4.6  CL 99 99 98 102  CO2 20* 24 29 25   GLUCOSE 140* 166* 144* 165*  BUN 10 12 20 20   CREATININE 0.67 0.63 0.83 0.71  CALCIUM 8.6* 9.0 9.3 8.7*   GFR: Estimated Creatinine Clearance: 138.8 mL/min (by C-G formula based on SCr of 0.71 mg/dL). Liver Function Tests: Recent Labs  Lab 02/20/20 1208 02/21/20 0410 02/22/20 0503 02/23/20 0402  AST 62* 37 32 27  ALT 68* 62* 61* 57*  ALKPHOS 69 71 62 54  BILITOT 1.3* 0.8 0.9 0.7  PROT 7.4 7.2 7.5 6.4*  ALBUMIN 3.5 3.5 3.6 3.1*   Anemia Panel: Recent Labs    02/21/20 0410  FERRITIN 1,323*   Urine analysis:    Component Value Date/Time   BILIRUBINUR negative 01/21/2015 1821   KETONESUR negative 01/21/2015 1821   PROTEINUR negative 01/21/2015 1821   UROBILINOGEN 0.2 01/21/2015 1821   NITRITE Negative 01/21/2015 1821   LEUKOCYTESUR Negative 01/21/2015 1821   Recent Results (from the past 240 hour(s))  Respiratory Panel by RT PCR (Flu A&B, Covid) - Nasopharyngeal Swab     Status: Abnormal   Collection Time: 02/20/20 12:08 PM   Specimen: Nasopharyngeal Swab  Result Value Ref Range Status   SARS Coronavirus 2 by RT PCR POSITIVE (A) NEGATIVE Final    Comment: RESULT CALLED TO, READ BACK BY AND VERIFIED WITH: HALL,C. RN @1354  ON 11.11.2021 BY COHEN,K (NOTE) SARS-CoV-2 target nucleic acids are DETECTED.  SARS-CoV-2 RNA is generally detectable in upper respiratory specimens  during the acute phase of infection. Positive results are indicative of the presence of the identified virus, but do not rule out bacterial infection or co-infection with other pathogens not detected by the test.  Clinical correlation with patient history and other diagnostic information is necessary to determine patient infection status. The expected result is Negative.  Fact Sheet for Patients:  PinkCheek.be  Fact Sheet for Healthcare Providers: GravelBags.it  This test is not yet approved or cleared by the [place] FDA and  has been authorized for detection and/or diagnosis of SARS-CoV-2 by FDA under an Emergency Use Authorization (EUA).  This EUA will remain in effect (meaning this test  can be used) for the duration of  the COVID-19 declaration under Section 564(b)(1) of the Act, 21 U.S.C. section 360bbb-3(b)(1), unless the authorization is terminated or revoked sooner.      Influenza A by PCR NEGATIVE NEGATIVE Final   Influenza B by PCR NEGATIVE NEGATIVE Final    Comment: (NOTE) The Xpert Xpress SARS-CoV-2/FLU/RSV assay is intended as an aid in  the diagnosis of influenza from Nasopharyngeal swab  specimens and  should not be used as a sole basis for treatment. Nasal washings and  aspirates are unacceptable for Xpert Xpress SARS-CoV-2/FLU/RSV  testing.  Fact Sheet for Patients: PinkCheek.be  Fact Sheet for Healthcare Providers: GravelBags.it  This test is not yet approved or cleared by the [place] FDA and  has been authorized for detection and/or diagnosis of SARS-CoV-2 by  FDA under an Emergency Use Authorization (EUA). This EUA will remain  in effect (meaning this test can be used) for the duration of the  Covid-19 declaration under Section 564(b)(1) of the Act, 21  U.S.C. section 360bbb-3(b)(1), unless the authorization is  terminated or revoked. Performed at [facility], [place] [address] [place], [place] [address]   Blood Culture (routine x 2)     Status: None (Preliminary result)   Collection Time: 02/20/20 12:08 PM   Specimen:  Site Not Specified; Blood  Result Value Ref Range Status   Specimen Description   Final    SITE NOT SPECIFIED Performed at [facility] [address], [place], [place] [address]    Special Requests   Final    BOTTLES DRAWN AEROBIC AND ANAEROBIC Blood Culture results may not be optimal due to an inadequate volume of blood received in culture bottles Performed at [facility] [address], [place], [place] [address]    Culture   Final    NO GROWTH 3 DAYS Performed at [facility], [place] [address], [place], [place] [address]    Report Status PENDING  Incomplete  Blood Culture (routine x 2)     Status: None (Preliminary result)   Collection Time: 02/20/20 12:30 PM   Specimen: BLOOD LEFT FOREARM  Result Value Ref Range Status   Specimen Description   Final    BLOOD LEFT FOREARM Performed at [facility] [address] [place], [place] [address]    Special Requests   Final    BOTTLES DRAWN AEROBIC AND ANAEROBIC Blood Culture adequate volume Performed at [facility] [address] [place], [place] [address]    Culture   Final    NO GROWTH 3 DAYS Performed at [facility], [place] [address] [place], [place] [address]    Report Status PENDING  Incomplete      Radiology Studies: CT ANGIO CHEST PE W OR WO CONTRAST  Result Date: 02/21/2020 CLINICAL DATA:  34 year old male with shortness of breath. Positive COVID-19. EXAM: CT ANGIOGRAPHY CHEST WITH CONTRAST TECHNIQUE: Multidetector CT imaging of the chest was performed using the standard protocol during bolus administration of intravenous contrast. Multiplanar CT image reconstructions and MIPs were obtained to evaluate the vascular anatomy. CONTRAST:  168mL OMNIPAQUE IOHEXOL 350 MG/ML SOLN COMPARISON:  Portable chest 0815 hours today. FINDINGS: Cardiovascular: Adequate contrast bolus timing in the pulmonary arterial tree. Respiratory motion. No  central or hilar pulmonary embolus. Other pulmonary artery branch detail is degraded. No cardiomegaly or pericardial effusion. Negative visible aorta. Mediastinum/Nodes: Mildly reactive appearing bilateral hilar lymph nodes. Lungs/Pleura: Left greater than right lower lobe consolidation with widespread superimposed bilateral upper lobe peribronchial and peripheral pulmonary ground-glass opacity. Similar involvement in the lingula. The right middle lobe is relatively spared. No pleural effusion. Upper Abdomen: Hepatic steatosis. Negative visible spleen, right adrenal gland and bowel in the upper abdomen. Musculoskeletal: No acute osseous abnormality identified. Review of the MIP images confirms the above findings. IMPRESSION: 1. No central or hilar pulmonary embolus. Other pulmonary artery  branch detail is degraded by motion. 2. Bilateral COVID-19 pneumonia with lower lobe consolidation. No pleural effusion. 3. Hepatic steatosis. Electronically Signed   By: Genevie Ann M.D.   On: 02/21/2020 21:26    Scheduled Meds:  baricitinib  4 mg Oral Daily   dexamethasone (DECADRON) injection  6 mg Intravenous Q24H   enoxaparin (LOVENOX) injection  40 mg Subcutaneous Q24H   mometasone-formoterol  2 puff Inhalation BID   oxymetazoline  1 spray Each Nare BID   sodium chloride flush  3 mL Intravenous Q12H   Continuous Infusions:  remdesivir 100 mg in NS 100 mL 100 mg (02/23/20 1010)     LOS: 3 days   Time spent: 35 minutes.  Patrecia Pour, MD [facility] [email or web address] 02/23/2020, 3:45 PM

## 2020-02-24 DIAGNOSIS — J4530 Mild persistent asthma, uncomplicated: Secondary | ICD-10-CM | POA: Diagnosis not present

## 2020-02-24 DIAGNOSIS — J9601 Acute respiratory failure with hypoxia: Secondary | ICD-10-CM | POA: Diagnosis not present

## 2020-02-24 DIAGNOSIS — U071 COVID-19: Secondary | ICD-10-CM | POA: Diagnosis not present

## 2020-02-24 NOTE — Progress Notes (Signed)
Patient was on 5L this AM. Orders to Wean as tolerated.  Decreased to 4L [place] throughout the day.  Patient remained in the mid 90s(95%).  About 1745 this writer entered into patient's room while patient was standing up ordering his dinner.  O2 was placed on 3L and Oxygen dropped to 75% on 3L; immediately turned back up to 4L.  By the end of shift, Oxygen was weaned down 3L.  Patient has maintained 3L  and sats have remained in the mid-high 90s.  Patient will notify nurse if he feels winded or SOB.  Wife also called and received update on patient's condition.  Inquiring if patient may take shower.  This information will be addressed with the provider.  Informed wife that patient needs to be able to maintain Oxygen levels 1st.

## 2020-02-24 NOTE — Progress Notes (Signed)
PROGRESS NOTE  [name]  KPT:[id number] DOB: 09/22/1985 DOA: 02/20/2020 PCP: [name], MD   Brief Narrative: [name] is a 34 y.o. male with a history of asthma and covid-19 infection diagnosed 11/2 after developing fever, cough, sinus symptoms on 11/1, ultimately developing shortness of breath that progressed to hypoxia at home prompting ED visit 11/11. He had been taking prednisone, azithromycin, and ivermectin as an outpatient and received monoclonal antibody in [place] 11/10. In the ED he was hypoxic requiring 1L O2 with bilateral mid-lower airspace infiltrates on CXR, confirmed to be SARS-CoV-2 positive with inflammatory marker elevation (CRP 3.4, PCT 0.12, lactic acid 1.4). Remdesivir was offered but declined initially, so decadron and baricitinib were given and he was admitted for acute hypoxic respiratory failure due to covid-19 pneumonia. Hypoxia has remained severe although it is overall improving.   Assessment & Plan: Active Problems:   Chronic asthma   Acute hypoxemic respiratory failure due to COVID-19 [facility])  Acute hypoxemic respiratory failure due to covid-19 pneumonia: SARS-CoV-2 initially positive on 11/2, symptoms began 11/1, s/p mAb 11/10, hypoxic on 11/12.  - Remains hypoxemic at rest requiring 5L O2 by [place]. Urged to ambulate, wean oxygen today as tolerated.  - Continue remdesivir x5 days (11/12 - 11/16) - Continue decadron. CRP normalized starting 11/13. - Continue baricitinib, started 11/11, for 14 days or until hospital discharge. Monitor Cr, LFTs, differential. Keep on VTE ppx. - Encouraged OOB, IS - Continue airborne, contact precautions for 21 days from positive testing. - Nasal saline, trial afrin for congestion.  Anxiety: Improved.  Asthma:  - Continue bronchodilators and steroids as above. No current wheezing.  LFT elevation: Due to viral infection, improving significantly. Will recheck 11/17.  Lymphopenia: Improved, due to viral  infection.   Hyponatremia: Resolved.  Obesity: Estimated body mass index is 30.85 kg/m as calculated from the following:   Height as of this encounter: 5\' 7"  (1.702 m).   Weight as of this encounter: 89.4 kg.  DVT prophylaxis: Lovenox  Code Status: Full Family Communication: Wife by phone at time of encounter. Disposition Plan:  Status is: Inpatient  Remains inpatient appropriate because:Ongoing diagnostic testing needed not appropriate for outpatient work up and Inpatient level of care appropriate due to severity of illness - remains on 5L O2 at rest this AM.  Dispo: The patient is from: Home              Anticipated d/c is to: Home              Anticipated d/c date is: 2 days              Patient currently is not medically stable to d/c.  Consultants:   None  Procedures:   None  Antimicrobials:  Remdesivir 11/12 - 11/16   Subjective: Slept a bit better, anxiety improved. Feels breathing is about the same from yesterday though much improved overall. No new complaints. Would like to leave the hospital by his 5 year wedding anniversary on Friday.   Objective: Vitals:   02/23/20 0649 02/23/20 1321 02/23/20 2026 02/24/20 0452  BP: (!) 141/106 (!) 135/93 (!) 137/98 (!) 135/101  Pulse: 79 99 94 82  Resp:  16 (!) 21 14  Temp:  97.9 F (36.6 C) 98.3 F (36.8 C) 98 F (36.7 C)  TempSrc:  Oral  Oral  SpO2:  97% 97% 95%  Weight:      Height:        Intake/Output Summary (Last  24 hours) at 02/24/2020 1020 Last data filed at 02/23/2020 1819 Gross per 24 hour  Intake 480 ml  Output --  Net 480 ml   Filed Weights   02/20/20 2119  Weight: 89.4 kg   Gen: 34 y.o. male in no distress Pulm: Nonlabored breathing 5L O2 SpO2 91-94%, significantly better aeration with crackles at R > L bases without wheezes. CV: Regular rate and rhythm. No murmur, rub, or gallop. No JVD, no dependent edema. GI: Abdomen soft, non-tender, non-distended, with normoactive bowel sounds.  Ext:  Warm, no deformities Skin: No rashes, lesions or ulcers on visualized skin. Neuro: Alert and oriented. No focal neurological deficits. Psych: Judgement and insight appear fair. Mood euthymic & affect congruent. Behavior is appropriate.    Data Reviewed: I have personally reviewed following labs and imaging studies  CBC: Recent Labs  Lab 02/20/20 1208 02/21/20 0410  WBC 6.0 3.5*  NEUTROABS 5.1 2.2  HGB 16.1 15.9  HCT 46.2 48.0  MCV 88.0 89.4  PLT 251 341   Basic Metabolic Panel: Recent Labs  Lab 02/20/20 1208 02/21/20 0410 02/22/20 0503 02/23/20 0402  NA 134* 136 138 137  K 3.9 4.1 4.2 4.6  CL 99 99 98 102  CO2 20* 24 29 25   GLUCOSE 140* 166* 144* 165*  BUN 10 12 20 20   CREATININE 0.67 0.63 0.83 0.71  CALCIUM 8.6* 9.0 9.3 8.7*   GFR: Estimated Creatinine Clearance: 138.8 mL/min (by C-G formula based on SCr of 0.71 mg/dL). Liver Function Tests: Recent Labs  Lab 02/20/20 1208 02/21/20 0410 02/22/20 0503 02/23/20 0402  AST 62* 37 32 27  ALT 68* 62* 61* 57*  ALKPHOS 69 71 62 54  BILITOT 1.3* 0.8 0.9 0.7  PROT 7.4 7.2 7.5 6.4*  ALBUMIN 3.5 3.5 3.6 3.1*   Anemia Panel: No results for input(s): VITAMINB12, FOLATE, FERRITIN, TIBC, IRON, RETICCTPCT in the last 72 hours. Urine analysis:    Component Value Date/Time   BILIRUBINUR negative 01/21/2015 1821   KETONESUR negative 01/21/2015 1821   PROTEINUR negative 01/21/2015 1821   UROBILINOGEN 0.2 01/21/2015 1821   NITRITE Negative 01/21/2015 1821   LEUKOCYTESUR Negative 01/21/2015 1821   Recent Results (from the past 240 hour(s))  Respiratory Panel by RT PCR (Flu A&B, Covid) - Nasopharyngeal Swab     Status: Abnormal   Collection Time: 02/20/20 12:08 PM   Specimen: Nasopharyngeal Swab  Result Value Ref Range Status   SARS Coronavirus 2 by RT PCR POSITIVE (A) NEGATIVE Final    Comment: RESULT CALLED TO, READ BACK BY AND VERIFIED WITH: HALL,C. RN @1354  ON 11.11.2021 BY COHEN,K (NOTE) SARS-CoV-2 target nucleic  acids are DETECTED.  SARS-CoV-2 RNA is generally detectable in upper respiratory specimens  during the acute phase of infection. Positive results are indicative of the presence of the identified virus, but do not rule out bacterial infection or co-infection with other pathogens not detected by the test. Clinical correlation with patient history and other diagnostic information is necessary to determine patient infection status. The expected result is Negative.  Fact Sheet for Patients:  PinkCheek.be  Fact Sheet for Healthcare Providers: GravelBags.it  This test is not yet approved or cleared by the [place] FDA and  has been authorized for detection and/or diagnosis of SARS-CoV-2 by FDA under an Emergency Use Authorization (EUA).  This EUA will remain in effect (meaning this test  can be used) for the duration of  the COVID-19 declaration under Section 564(b)(1) of the Act, 21 U.S.C. section  360bbb-3(b)(1), unless the authorization is terminated or revoked sooner.      Influenza A by PCR NEGATIVE NEGATIVE Final   Influenza B by PCR NEGATIVE NEGATIVE Final    Comment: (NOTE) The Xpert Xpress SARS-CoV-2/FLU/RSV assay is intended as an aid in  the diagnosis of influenza from Nasopharyngeal swab specimens and  should not be used as a sole basis for treatment. Nasal washings and  aspirates are unacceptable for Xpert Xpress SARS-CoV-2/FLU/RSV  testing.  Fact Sheet for Patients: PinkCheek.be  Fact Sheet for Healthcare Providers: GravelBags.it  This test is not yet approved or cleared by the [place] FDA and  has been authorized for detection and/or diagnosis of SARS-CoV-2 by  FDA under an Emergency Use Authorization (EUA). This EUA will remain  in effect (meaning this test can be used) for the duration of the  Covid-19 declaration under Section 564(b)(1) of  the Act, 21  U.S.C. section 360bbb-3(b)(1), unless the authorization is  terminated or revoked. Performed at [facility], [place] [address] [place], [place] [address]   Blood Culture (routine x 2)     Status: None (Preliminary result)   Collection Time: 02/20/20 12:08 PM   Specimen: Site Not Specified; Blood  Result Value Ref Range Status   Specimen Description   Final    SITE NOT SPECIFIED Performed at [facility] [address] [place], [place] [address]    Special Requests   Final    BOTTLES DRAWN AEROBIC AND ANAEROBIC Blood Culture results may not be optimal due to an inadequate volume of blood received in culture bottles Performed at [facility] [address] [place], [place] [address]    Culture   Final    NO GROWTH 3 DAYS Performed at [facility], [place] [address], [place], [place] [address]    Report Status PENDING  Incomplete  Blood Culture (routine x 2)     Status: None (Preliminary result)   Collection Time: 02/20/20 12:30 PM   Specimen: BLOOD LEFT FOREARM  Result Value Ref Range Status   Specimen Description   Final    BLOOD LEFT FOREARM Performed at [facility] [address], [place], [place] [address]    Special Requests   Final    BOTTLES DRAWN AEROBIC AND ANAEROBIC Blood Culture adequate volume Performed at [facility] [address], [place], [place] [address]    Culture   Final    NO GROWTH 3 DAYS Performed at [facility], [place] [address], [place],  [address]    Report Status PENDING  Incomplete      Radiology Studies: No results found.  Scheduled Meds: . baricitinib  4 mg Oral Daily  . dexamethasone (DECADRON) injection  6 mg Intravenous Q24H  . enoxaparin (LOVENOX) injection  40 mg Subcutaneous Q24H  . mometasone-formoterol  2 puff Inhalation BID  . oxymetazoline  1 spray Each Nare BID  . sodium chloride flush  3 mL  Intravenous Q12H   Continuous Infusions: . remdesivir 100 mg in NS 100 mL Stopped (02/23/20 1045)     LOS: 4 days   Time spent: 35 minutes.  Patrecia Pour, MD [facility] [email or web address] 02/24/2020, 10:20 AM

## 2020-02-25 DIAGNOSIS — J9601 Acute respiratory failure with hypoxia: Secondary | ICD-10-CM | POA: Diagnosis not present

## 2020-02-25 DIAGNOSIS — J4530 Mild persistent asthma, uncomplicated: Secondary | ICD-10-CM | POA: Diagnosis not present

## 2020-02-25 DIAGNOSIS — U071 COVID-19: Secondary | ICD-10-CM | POA: Diagnosis not present

## 2020-02-25 LAB — CULTURE, BLOOD (ROUTINE X 2)
Culture: NO GROWTH
Culture: NO GROWTH
Special Requests: ADEQUATE

## 2020-02-25 MED ORDER — DEXAMETHASONE 6 MG PO TABS: 6.0000 mg | ORAL_TABLET | Freq: Every day | ORAL | 0 refills | Status: AC

## 2020-02-25 NOTE — TOC Transition Note (Signed)
Transition of Care [facility]) - CM/SW Discharge Note   Patient Details  Name: [name] MRN: [id number] Date of Birth: July 08, 1985  [facility] of [facility]) CM/SW Contact:  Trish Mage, LCSW Phone Number: 02/25/2020, 11:16 AM   Clinical Narrative:   Patient who is scheduled for d/c is in need of home O2, has a ride. I contacted Caryl Pina with [employer] who will arrange for deliver of travel cannister and home unit. SAT note and orders seen and appreciated. No further needs identified. TOC sign off.    Final next level of care: Home/Self Care Barriers to Discharge: No Barriers Identified   Patient Goals and CMS Choice        Discharge Placement                       Discharge Plan and Services                                     Social Determinants of Health (SDOH) Interventions     Readmission Risk Interventions No flowsheet data found.

## 2020-02-25 NOTE — Discharge Summary (Signed)
Physician Discharge Summary  [name] ZWC:[id number] DOB: January 14, 1986 DOA: 02/20/2020  PCP: [name], MD  Admit date: 02/20/2020 Discharge date: 02/25/2020  Admitted From: Home Disposition: Home   Recommendations for Outpatient Follow-up:  1. Follow up with PCP in 1-2 weeks.  Home Health: None Equipment/Devices: 2L O2 with exertion Discharge Condition: Stable CODE STATUS: Full Diet recommendation: As tolerated  Brief/Interim Summary: [name] is a 34 y.o. male with a history of asthma and covid-19 infection diagnosed 11/2 after developing fever, cough, sinus symptoms on 11/1, ultimately developing shortness of breath that progressed to hypoxia at home prompting ED visit 11/11. He had been taking prednisone, azithromycin, and ivermectin as an outpatient and received monoclonal antibody in  11/10. In the ED he was hypoxic requiring 1L O2 with bilateral mid-lower airspace infiltrates on CXR, confirmed to be SARS-CoV-2 positive with inflammatory marker elevation (CRP 3.4, PCT 0.12, lactic acid 1.4). Remdesivir was offered but declined initially, so decadron and baricitinib were given and he was admitted for acute hypoxic respiratory failure due to covid-19 pneumonia. Hypoxia worsened significantly right after admission, requiring 15L HFNC. CTA showed no PE, but confirmed covid pneumonia. Remdesivir was added, and with 5 days total of therapy, hypoxia has improved significantly. On the day of discharge, he is ambulating with limited dyspnea and hypoxia now only requiring 2L O2 with exertion.  Discharge Diagnoses:  Active Problems:   Chronic asthma   Acute hypoxemic respiratory failure due to COVID-19 [facility])  Acute hypoxemic respiratory failure due to covid-19 pneumonia: SARS-CoV-2 initially positive on 11/2, symptoms began 11/1, s/p mAb 11/10, hypoxic on 11/12.  - Discharge on 2L O2 with exertion with anticipation of continued clearance and resolution of hypoxia over  the coming week. Return precautions advised.  - Has completed remdesivir x5 days on the day of discharge. Will continue decadron, noting CRP normalized 11/13.  - Note, pt received baricitinib 11/11 - 11/16 started 11/11, for 14 days or until hospital discharge. Monitor Cr, LFTs, differential. Keep on VTE ppx. - Continue airborne, contact precautions for 21 days from positive testing.  Anxiety: Improved.  Asthma:  - Continue bronchodilators and steroids as above. No current wheezing.  LFT elevation: Due to viral infection, improving significantly. Recommend recheck at follow up.  Lymphopenia: Improved, due to viral infection.   Hyponatremia: Resolved.  Obesity: Estimated body mass index is 30.85 kg/m   Discharge Instructions Discharge Instructions    Discharge instructions   Complete by: As directed    You are being discharged from the hospital after treatment for covid-19 infection. You are felt to be stable enough to no longer require inpatient monitoring, testing, and treatment, though you will need to follow the recommendations below: - Continue taking decadron 6mg  daily starting today and continuing for a total of 5 more days.  - Per CDC guidelines, you will need to remain in isolation for 21 days from your first positive covid test. - Follow up with your doctor in the next week via telehealth or seek medical attention right away if your symptoms get [place] are encouraged to get a covid vaccination between 21 days (after isolation period ends) and 90 days (before immunity is thought to wear off).  Directions for you at home:  Wear a facemask You should wear a facemask that covers your nose and mouth when you are in the same room with other people and when you visit a healthcare provider. People who live with or visit you should  also wear a facemask while they are in the same room with you.  Separate yourself from other people in your home As much as possible, you  should stay in a different room from other people in your home. Also, you should use a separate bathroom, if available.  Avoid sharing household items You should not share dishes, drinking glasses, cups, eating utensils, towels, bedding, or other items with other people in your home. After using these items, you should wash them thoroughly with soap and water.  Cover your coughs and sneezes Cover your mouth and nose with a tissue when you cough or sneeze, or you can cough or sneeze into your sleeve. Throw used tissues in a lined trash can, and immediately wash your hands with soap and water for at least 20 seconds or use an alcohol-based hand rub.  Wash your [facility] your hands often and thoroughly with soap and water for at least 20 seconds. You can use an alcohol-based hand sanitizer if soap and water are not available and if your hands are not visibly dirty. Avoid touching your eyes, nose, and mouth with unwashed hands.  Directions for those who live with, or provide care at home for you:  Limit the number of people who have contact with the patient If possible, have only one caregiver for the patient. Other household members should stay in another home or place of residence. If this is not possible, they should stay in another room, or be separated from the patient as much as possible. Use a separate bathroom, if available. Restrict visitors who do not have an essential need to be in the home.  Ensure good ventilation Make sure that shared spaces in the home have good air flow, such as from an air conditioner or an opened window, weather permitting.  Wash your hands often Wash your hands often and thoroughly with soap and water for at least 20 seconds. You can use an alcohol based hand sanitizer if soap and water are not available and if your hands are not visibly dirty. Avoid touching your eyes, nose, and mouth with unwashed hands. Use disposable paper towels to dry your hands.  If not available, use dedicated cloth towels and replace them when they become wet.  Wear a facemask and gloves Wear a disposable facemask at all times in the room and gloves when you touch or have contact with the patient's blood, body fluids, and/or secretions or excretions, such as sweat, saliva, sputum, nasal mucus, vomit, urine, or feces.  Ensure the mask fits over your nose and mouth tightly, and do not touch it during use. Throw out disposable facemasks and gloves after using them. Do not reuse. Wash your hands immediately after removing your facemask and gloves. If your personal clothing becomes contaminated, carefully remove clothing and launder. Wash your hands after handling contaminated clothing. Place all used disposable facemasks, gloves, and other waste in a lined container before disposing them with other household waste. Remove gloves and wash your hands immediately after handling these items.  Do not share dishes, glasses, or other household items with the patient Avoid sharing household items. You should not share dishes, drinking glasses, cups, eating utensils, towels, bedding, or other items with a patient who is confirmed to have, or being evaluated for, COVID-19 infection. After the person uses these items, you should wash them thoroughly with soap and water.  Wash laundry thoroughly Immediately remove and wash clothes or bedding that have blood, body fluids, and/or secretions  or excretions, such as sweat, saliva, sputum, nasal mucus, vomit, urine, or feces, on them. Wear gloves when handling laundry from the patient. Read and follow directions on labels of laundry or clothing items and detergent. In general, wash and dry with the warmest temperatures recommended on the label.  Clean all areas the individual has used often Clean all touchable surfaces, such as counters, tabletops, doorknobs, bathroom fixtures, toilets, phones, keyboards, tablets, and bedside tables, every  day. Also, clean any surfaces that may have blood, body fluids, and/or secretions or excretions on them. Wear gloves when cleaning surfaces the patient has come in contact with. Use a diluted bleach solution (e.g., dilute bleach with 1 part bleach and 10 parts water) or a household disinfectant with a label that says EPA-registered for coronaviruses. To make a bleach solution at home, add 1 tablespoon of bleach to 1 quart (4 cups) of water. For a larger supply, add  cup of bleach to 1 gallon (16 cups) of water. Read labels of cleaning products and follow recommendations provided on product labels. Labels contain instructions for safe and effective use of the cleaning product including precautions you should take when applying the product, such as wearing gloves or eye protection and making sure you have good ventilation during use of the product. Remove gloves and wash hands immediately after cleaning.  Monitor yourself for signs and symptoms of illness Caregivers and household members are considered close contacts, should monitor their health, and will be asked to limit movement outside of the home to the extent possible. Follow the monitoring steps for close contacts listed on the symptom monitoring form.  If you have additional questions, contact your local health department or call the epidemiologist on call at [phone] (available 24/7). This guidance is subject to change. For the most up-to-date guidance from [facility], please refer to their website: YouBlogs.pl   MyChart COVID-19 home monitoring program   Complete by: Feb 25, 2020    Is the patient willing to use the [place] for home monitoring?: Yes     Allergies as of 02/25/2020      Reactions   Peanuts [peanut Oil] Shortness Of Breath      Medication List    STOP taking these medications   azithromycin 250 MG tablet Commonly known as: ZITHROMAX    ivermectin 3 MG Tabs tablet Commonly known as: STROMECTOL   predniSONE 10 MG tablet Commonly known as: DELTASONE     TAKE these medications   Advair Diskus 500-50 MCG/DOSE Aepb Generic drug: Fluticasone-Salmeterol 1 puff by mouth twice per day What changed:   how much to take  how to take this  when to take this  additional instructions   albuterol 108 (90 Base) MCG/ACT inhaler Commonly known as: VENTOLIN HFA TAKE 2 PUFFS BY MOUTH EVERY 6 HOURS AS NEEDED FOR WHEEZE OR SHORTNESS OF BREATH What changed: See the new instructions.   albuterol (2.5 MG/3ML) 0.083% nebulizer solution Commonly known as: PROVENTIL Take 3 mLs (2.5 mg total) by nebulization every 6 (six) hours as needed for wheezing or shortness of breath. What changed: Another medication with the same name was changed. Make sure you understand how and when to take each.   benzonatate 100 MG capsule Commonly known as: TESSALON Take 100 mg by mouth 3 (three) times daily as needed for cough.   dexamethasone 6 MG tablet Commonly known as: DECADRON Take 1 tablet (6 mg total) by mouth daily for 5 days. starting 11/16 What changed: additional  instructions   HYDROcodone-homatropine 5-1.5 MG/5ML syrup Commonly known as: HYCODAN Take 5 mLs by mouth every 8 (eight) hours as needed for cough.   ibuprofen 200 MG tablet Commonly known as: ADVIL Take 400 mg by mouth every 6 (six) hours as needed for fever, headache or mild pain.   VITAMIN C PO Take 1 tablet by mouth in the morning and at bedtime.   VITAMIN D PO Take 1 capsule by mouth in the morning and at bedtime.   ZINC PO Take 1 tablet by mouth in the morning and at bedtime.            Durable Medical Equipment  (From admission, onward)         Start     Ordered   02/25/20 1049  For home use only DME oxygen  Once       Question Answer Comment  Length of Need 6 Months   Mode or (Route) Nasal cannula   Liters per Minute 2   Frequency Continuous  (stationary and portable oxygen unit needed)   Oxygen delivery system Gas      02/25/20 1048          Follow-up Information    [name], MD. Schedule an appointment as soon as possible for a visit in 1 week(s).   Specialty: Family Medicine Contact information: [address] [place] [address] [phone]              Allergies  Allergen Reactions  . Peanuts [Peanut Oil] Shortness Of Breath    Consultations:  None  Procedures/Studies: CT ANGIO CHEST PE W OR WO CONTRAST  Result Date: 02/21/2020 CLINICAL DATA:  34 year old male with shortness of breath. Positive COVID-19. EXAM: CT ANGIOGRAPHY CHEST WITH CONTRAST TECHNIQUE: Multidetector CT imaging of the chest was performed using the standard protocol during bolus administration of intravenous contrast. Multiplanar CT image reconstructions and MIPs were obtained to evaluate the vascular anatomy. CONTRAST:  156mL OMNIPAQUE IOHEXOL 350 MG/ML SOLN COMPARISON:  Portable chest 0815 hours today. FINDINGS: Cardiovascular: Adequate contrast bolus timing in the pulmonary arterial tree. Respiratory motion. No central or hilar pulmonary embolus. Other pulmonary artery branch detail is degraded. No cardiomegaly or pericardial effusion. Negative visible aorta. Mediastinum/Nodes: Mildly reactive appearing bilateral hilar lymph nodes. Lungs/Pleura: Left greater than right lower lobe consolidation with widespread superimposed bilateral upper lobe peribronchial and peripheral pulmonary ground-glass opacity. Similar involvement in the lingula. The right middle lobe is relatively spared. No pleural effusion. Upper Abdomen: Hepatic steatosis. Negative visible spleen, right adrenal gland and bowel in the upper abdomen. Musculoskeletal: No acute osseous abnormality identified. Review of the MIP images confirms the above findings. IMPRESSION: 1. No central or hilar pulmonary embolus. Other pulmonary artery branch detail is degraded by  motion. 2. Bilateral COVID-19 pneumonia with lower lobe consolidation. No pleural effusion. 3. Hepatic steatosis. Electronically Signed   By: Genevie Ann M.D.   On: 02/21/2020 21:26   DG CHEST PORT 1 VIEW  Result Date: 02/21/2020 CLINICAL DATA:  COVID-19 pneumonia EXAM: PORTABLE CHEST 1 VIEW COMPARISON:  02/20/2020 FINDINGS: Cardiac shadow is stable. Patchy opacities are noted bilaterally consistent with the given clinical history. The overall appearance is stable from the previous day. No bony abnormality is noted. IMPRESSION: Stable opacities bilaterally consistent with COVID-19 pneumonia. Electronically Signed   By: Inez Catalina M.D.   On: 02/21/2020 08:37   DG Chest Port 1 View  Result Date: 02/20/2020 CLINICAL DATA:  Shortness of breath. History of COVID-19 positive  status EXAM: PORTABLE CHEST 1 VIEW COMPARISON:  None. FINDINGS: There is airspace opacity in each mid and lower lung region. Heart size and pulmonary vascularity are normal. No adenopathy. No bone lesions. IMPRESSION: Multifocal airspace opacity, likely due to atypical organism pneumonia. Heart size normal. No adenopathy. Electronically Signed   By: Lowella Grip III M.D.   On: 02/20/2020 13:08    Subjective: Feels better, dyspnea very minimal with exertion, not present on exertion. No chest pain, leg swelling. Eager to go home. When standing for ~5-10 minutes, speaking conversationally has no dyspnea or palpitations or chest pain. SpO2 remaining >91% on 2L O2.  Discharge Exam: Vitals:   02/24/20 2014 02/25/20 0357  BP: 125/79 (!) 119/91  Pulse: 100 80  Resp: 18 18  Temp: 98.5 F (36.9 C) 98.7 F (37.1 C)  SpO2: 94% 97%   General: Pt is alert, awake, not in acute distress Cardiovascular: RRR, S1/S2 +, no rubs, no gallops Respiratory: CTA bilaterally, no wheezing, no rhonchi Abdominal: Soft, NT, ND, bowel sounds + Extremities: No edema, no cyanosis  Labs: BNP (last 3 results) No results for input(s): BNP in the last  8760 hours. Basic Metabolic Panel: Recent Labs  Lab 02/20/20 1208 02/21/20 0410 02/22/20 0503 02/23/20 0402  NA 134* 136 138 137  K 3.9 4.1 4.2 4.6  CL 99 99 98 102  CO2 20* 24 29 25   GLUCOSE 140* 166* 144* 165*  BUN 10 12 20 20   CREATININE 0.67 0.63 0.83 0.71  CALCIUM 8.6* 9.0 9.3 8.7*   Liver Function Tests: Recent Labs  Lab 02/20/20 1208 02/21/20 0410 02/22/20 0503 02/23/20 0402  AST 62* 37 32 27  ALT 68* 62* 61* 57*  ALKPHOS 69 71 62 54  BILITOT 1.3* 0.8 0.9 0.7  PROT 7.4 7.2 7.5 6.4*  ALBUMIN 3.5 3.5 3.6 3.1*   No results for input(s): LIPASE, AMYLASE in the last 168 hours. No results for input(s): AMMONIA in the last 168 hours. CBC: Recent Labs  Lab 02/20/20 1208 02/21/20 0410  WBC 6.0 3.5*  NEUTROABS 5.1 2.2  HGB 16.1 15.9  HCT 46.2 48.0  MCV 88.0 89.4  PLT 251 278   Cardiac Enzymes: No results for input(s): CKTOTAL, CKMB, CKMBINDEX, TROPONINI in the last 168 hours. BNP: Invalid input(s): POCBNP CBG: No results for input(s): GLUCAP in the last 168 hours. D-Dimer Recent Labs    02/23/20 0402  DDIMER 0.33   Hgb A1c No results for input(s): HGBA1C in the last 72 hours. Lipid Profile No results for input(s): CHOL, HDL, LDLCALC, TRIG, CHOLHDL, LDLDIRECT in the last 72 hours. Thyroid function studies No results for input(s): TSH, T4TOTAL, T3FREE, THYROIDAB in the last 72 hours.  Invalid input(s): FREET3 Anemia work up No results for input(s): VITAMINB12, FOLATE, FERRITIN, TIBC, IRON, RETICCTPCT in the last 72 hours. Urinalysis    Component Value Date/Time   BILIRUBINUR negative 01/21/2015 1821   KETONESUR negative 01/21/2015 1821   PROTEINUR negative 01/21/2015 1821   UROBILINOGEN 0.2 01/21/2015 1821   NITRITE Negative 01/21/2015 1821   LEUKOCYTESUR Negative 01/21/2015 1821    Microbiology Recent Results (from the past 240 hour(s))  Respiratory Panel by RT PCR (Flu A&B, Covid) - Nasopharyngeal Swab     Status: Abnormal   Collection  Time: 02/20/20 12:08 PM   Specimen: Nasopharyngeal Swab  Result Value Ref Range Status   SARS Coronavirus 2 by RT PCR POSITIVE (A) NEGATIVE Final    Comment: RESULT CALLED TO, READ BACK BY AND VERIFIED WITH:  HALL,C. RN @1354  ON 11.11.2021 BY COHEN,K (NOTE) SARS-CoV-2 target nucleic acids are DETECTED.  SARS-CoV-2 RNA is generally detectable in upper respiratory specimens  during the acute phase of infection. Positive results are indicative of the presence of the identified virus, but do not rule out bacterial infection or co-infection with other pathogens not detected by the test. Clinical correlation with patient history and other diagnostic information is necessary to determine patient infection status. The expected result is Negative.  Fact Sheet for Patients:  PinkCheek.be  Fact Sheet for Healthcare Providers: GravelBags.it  This test is not yet approved or cleared by the [place] FDA and  has been authorized for detection and/or diagnosis of SARS-CoV-2 by FDA under an Emergency Use Authorization (EUA).  This EUA will remain in effect (meaning this test  can be used) for the duration of  the COVID-19 declaration under Section 564(b)(1) of the Act, 21 U.S.C. section 360bbb-3(b)(1), unless the authorization is terminated or revoked sooner.      Influenza A by PCR NEGATIVE NEGATIVE Final   Influenza B by PCR NEGATIVE NEGATIVE Final    Comment: (NOTE) The Xpert Xpress SARS-CoV-2/FLU/RSV assay is intended as an aid in  the diagnosis of influenza from Nasopharyngeal swab specimens and  should not be used as a sole basis for treatment. Nasal washings and  aspirates are unacceptable for Xpert Xpress SARS-CoV-2/FLU/RSV  testing.  Fact Sheet for Patients: PinkCheek.be  Fact Sheet for Healthcare Providers: GravelBags.it  This test is not yet approved or  cleared by the [place] FDA and  has been authorized for detection and/or diagnosis of SARS-CoV-2 by  FDA under an Emergency Use Authorization (EUA). This EUA will remain  in effect (meaning this test can be used) for the duration of the  Covid-19 declaration under Section 564(b)(1) of the Act, 21  U.S.C. section 360bbb-3(b)(1), unless the authorization is  terminated or revoked. Performed at [facility], [place] [address] [place], [place] [address]   Blood Culture (routine x 2)     Status: None (Preliminary result)   Collection Time: 02/20/20 12:08 PM   Specimen: Site Not Specified; Blood  Result Value Ref Range Status   Specimen Description   Final    SITE NOT SPECIFIED Performed at [facility] [address] [place], [place] [address]    Special Requests   Final    BOTTLES DRAWN AEROBIC AND ANAEROBIC Blood Culture results may not be optimal due to an inadequate volume of blood received in culture bottles Performed at [facility] [address] [place], [place] [address]    Culture   Final    NO GROWTH 4 DAYS Performed at [facility], [place] [address], [place], [place] [address]    Report Status PENDING  Incomplete  Blood Culture (routine x 2)     Status: None (Preliminary result)   Collection Time: 02/20/20 12:30 PM   Specimen: BLOOD LEFT FOREARM  Result Value Ref Range Status   Specimen Description   Final    BLOOD LEFT FOREARM Performed at [facility] [address], [place], [place] [address]    Special Requests   Final    BOTTLES DRAWN AEROBIC AND ANAEROBIC Blood Culture adequate volume Performed at [facility] [address] [place], [place] [address]    Culture   Final    NO GROWTH 4 DAYS Performed at [facility], [place] [address], [place], [place]  [address]    Report Status PENDING  Incomplete    Time coordinating discharge: Approximately 40  minutes  Patrecia Pour, MD  [facility] 02/25/2020, 10:56 AM

## 2020-02-25 NOTE — Progress Notes (Signed)
Pt will be discharged home with family. Discharged instructions reviewed; patient verbalized understanding.  Peripheral IV discontinued. Patient will be discharged with DME Oxygen.

## 2020-02-25 NOTE — Progress Notes (Signed)
SATURATION QUALIFICATIONS: (This note is used to comply with regulatory documentation for home oxygen)  Patient Saturations on Room Air at Rest = 88% on RA   Patient Saturations on Room Air while Ambulating = 90% on 2L [place]  Patient Saturations on 2L Liters of oxygen while Ambulating = 91%  Please briefly explain why patient needs home oxygen:Due to COVID Diagnosis

## 2020-03-03 ENCOUNTER — Telehealth: Payer: 59 | Admitting: Family Medicine

## 2020-03-03 ENCOUNTER — Encounter: Payer: Self-pay | Admitting: Family Medicine

## 2020-03-03 ENCOUNTER — Other Ambulatory Visit: Payer: Self-pay

## 2020-03-03 VITALS — Temp 97.7°F | Wt 185.0 lb

## 2020-03-03 DIAGNOSIS — Z8616 Personal history of COVID-19: Secondary | ICD-10-CM | POA: Diagnosis not present

## 2020-03-03 NOTE — Progress Notes (Signed)
   Subjective:    Patient ID: [name], male    DOB: 1986-02-03, 34 y.o.   MRN: [id number]  HPI I connected with  [name] on 03/03/20 by a video enabled telemedicine application and verified that I am speaking with the correct person using two identifiers.  Caregility used.  I am in my office.  He is in his car. I discussed the limitations of evaluation and management by telemedicine. The patient expressed understanding and agreed to proceed. Today's visit is to discuss recent hospitalization and discharge.  He did indeed have Covid.  He did treat himself at home using steroids, ivermectin and azithromycin given to him through other sources.  He was admitted on November 11 and sent home on the 16th.  He did have evidence of Covid pneumonia.  The medical record including history and physical as well as discharge summary was reviewed.  He was sent home on oxygen however he stopped it the second day of being at home.  He states his pulse ox ranged from 94-97.  He continues on his allergy medications.  He is been walking regularly to increase his stamina.  He has had no difficulty with fever, headache, shortness of breath, GI or neurologic symptoms.   Review of Systems     Objective:   Physical Exam Alert and in no distress with normal breathing pattern apparent.       Assessment & Plan:  History of COVID-19 Discussed in detail the diagnosis and treatment.  He did have steroids in the hospital and I therefore recommended waiting 90 days before getting the vaccine which she plans to do.  Discussed flu shot and again recommended at least a month waiting before getting that.  At this time there is no evidence of post Covid symptoms.

## 2020-03-22 ENCOUNTER — Other Ambulatory Visit: Payer: Self-pay | Admitting: Family Medicine

## 2020-03-22 DIAGNOSIS — J4530 Mild persistent asthma, uncomplicated: Secondary | ICD-10-CM

## 2020-03-23 NOTE — Telephone Encounter (Signed)
[facility] is requesting to fill pt albuterol. Please advise [facility] 

## 2020-03-30 ENCOUNTER — Other Ambulatory Visit: Payer: Self-pay

## 2020-03-30 ENCOUNTER — Ambulatory Visit: Payer: 59 | Admitting: Family Medicine

## 2020-03-30 ENCOUNTER — Encounter: Payer: Self-pay | Admitting: Family Medicine

## 2020-03-30 VITALS — BP 142/90 | HR 99 | Temp 98.1°F | Wt 198.6 lb

## 2020-03-30 DIAGNOSIS — R6882 Decreased libido: Secondary | ICD-10-CM | POA: Diagnosis not present

## 2020-03-30 DIAGNOSIS — R5383 Other fatigue: Secondary | ICD-10-CM | POA: Diagnosis not present

## 2020-03-30 NOTE — Progress Notes (Signed)
   Subjective:    Patient ID: [name], male    DOB: [birth year], 34 y.o.   MRN: [id number]  HPI He is here for consult concerning fatigue.  He states that he has had difficulty with fatigue for the last 2 years and now also notes decreased libido.  He is also noted some sleep issues with sometimes falling asleep while sitting as well as in movies.  He admits to having stress but relates this to normal things like work, marriage and not a 26-year-old child.  He has had some sinus surgery and states he no longer snores.  He has also had recent Covid but states that the symptoms started before that.   Review of Systems     Objective:   Physical Exam Alert and in no distress. Tympanic membranes and canals are normal. Pharyngeal area is normal. Neck is supple without adenopathy or thyromegaly. Cardiac exam shows a regular sinus rhythm without murmurs or gallops. Lungs are clear to auscultation. DTRs are normal.       Assessment & Plan:  Fatigue, unspecified type - Plan: TSH  Low libido - Plan: Testosterone Some of his symptoms could be low testosterone related versus possibly OSA.  If these numbers come back normal I might pursue the OSA with a sleep study.

## 2020-03-31 LAB — TSH: TSH: 0.548 u[IU]/mL (ref 0.450–4.500)

## 2020-03-31 LAB — TESTOSTERONE: Testosterone: 421 ng/dL (ref 264–916)

## 2020-03-31 NOTE — Addendum Note (Signed)
Addended by: [name] on: 03/31/2020 10:20 AM   Modules accepted: Orders

## 2020-05-18 ENCOUNTER — Ambulatory Visit (HOSPITAL_BASED_OUTPATIENT_CLINIC_OR_DEPARTMENT_OTHER): Payer: 59

## 2020-05-18 ENCOUNTER — Other Ambulatory Visit: Payer: Self-pay

## 2020-06-02 ENCOUNTER — Ambulatory Visit (HOSPITAL_BASED_OUTPATIENT_CLINIC_OR_DEPARTMENT_OTHER): Payer: 59

## 2020-06-02 ENCOUNTER — Other Ambulatory Visit: Payer: Self-pay

## 2020-06-07 ENCOUNTER — Other Ambulatory Visit: Payer: Self-pay | Admitting: Family Medicine

## 2020-06-07 DIAGNOSIS — J4530 Mild persistent asthma, uncomplicated: Secondary | ICD-10-CM

## 2020-06-08 NOTE — Telephone Encounter (Signed)
Is this okay to refill? 

## 2020-07-13 ENCOUNTER — Ambulatory Visit (HOSPITAL_BASED_OUTPATIENT_CLINIC_OR_DEPARTMENT_OTHER): Payer: 59 | Attending: Family Medicine | Admitting: Internal Medicine

## 2020-07-13 ENCOUNTER — Other Ambulatory Visit: Payer: Self-pay

## 2020-07-13 VITALS — Ht 67.0 in | Wt 195.0 lb

## 2020-07-13 DIAGNOSIS — R5383 Other fatigue: Secondary | ICD-10-CM | POA: Diagnosis present

## 2020-07-13 DIAGNOSIS — G4733 Obstructive sleep apnea (adult) (pediatric): Secondary | ICD-10-CM | POA: Diagnosis not present

## 2020-07-21 DIAGNOSIS — R5383 Other fatigue: Secondary | ICD-10-CM | POA: Diagnosis not present

## 2020-07-21 NOTE — Procedures (Signed)
   Patient Name: [name], [name] Date: 07/14/2020 Gender: Male D.O.B: [birth year] Age (years): 34 Referring Provider: [name] Height (inches): 21 Interpreting Physician: Baird Lyons MD, ABSM Weight (lbs): 195 RPSGT: Jacolyn Reedy BMI: 31 MRN: [id number] Neck Size: 16.00  CLINICAL INFORMATION Sleep Study Type: HST Indication for sleep study: Fatigue Epworth Sleepiness Score: 15  SLEEP STUDY TECHNIQUE A multi-channel overnight portable sleep study was performed. The channels recorded were: nasal airflow, thoracic respiratory movement, and oxygen saturation with a pulse oximetry. Snoring was also monitored.  MEDICATIONS Patient self administered medications include: None reported.  SLEEP ARCHITECTURE Patient was studied for 373.2 minutes. The sleep efficiency was 100.0 % and the patient was supine for 45.8%. The arousal index was 0.0 per hour.  RESPIRATORY PARAMETERS The overall AHI was 32.5 per hour, with a central apnea index of 0 per hour. The oxygen nadir was 80% during sleep.  CARDIAC DATA Mean heart rate during sleep was 89.9 bpm.  IMPRESSIONS - Severe obstructive sleep apnea occurred during this study (AHI = 32.5/h). - Oxygen desaturation was noted during this study (Min O2 = 80%). Mean O2 sat 92%. - Patient snored.  DIAGNOSIS - Obstructive Sleep Apnea (G47.33)  RECOMMENDATIONS - Suggest CPAP titration sleep study or autopap. Other options would be based on clinical judgment. - Be careful with alcohol, sedatives and other CNS depressants that may worsen sleep apnea and disrupt normal sleep architecture. - Sleep hygiene should be reviewed to assess factors that may improve sleep quality. - Weight management and regular exercise should be initiated or continued.  [Electronically signed] 07/21/2020 01:53 PM  Baird Lyons MD, [place], American Board of Sleep Medicine   NPI: [id number]                           [facility]  ELECTRONICALLY SIGNED ON:  07/21/2020, 1:51 PM [place] PH: [phone]   FX: [phone] [place]

## 2020-07-22 ENCOUNTER — Other Ambulatory Visit: Payer: Self-pay

## 2020-07-22 DIAGNOSIS — G4733 Obstructive sleep apnea (adult) (pediatric): Secondary | ICD-10-CM | POA: Insufficient documentation

## 2021-01-11 ENCOUNTER — Telehealth: Payer: Self-pay

## 2021-01-11 MED ORDER — FLUTICASONE-SALMETEROL 500-50 MCG/ACT IN AEPB: 1.0000 | INHALATION_SPRAY | Freq: Two times a day (BID) | RESPIRATORY_TRACT | 3 refills | Status: DC

## 2021-01-11 NOTE — Telephone Encounter (Signed)
Received fax from [place] for a refill on the pts. Advair Diskus last apt was 03/30/20.

## 2021-04-30 ENCOUNTER — Telehealth: Payer: Self-pay

## 2021-04-30 MED ORDER — FLUTICASONE-SALMETEROL 500-50 MCG/ACT IN AEPB: 1.0000 | INHALATION_SPRAY | Freq: Two times a day (BID) | RESPIRATORY_TRACT | 3 refills | Status: DC

## 2021-04-30 NOTE — Telephone Encounter (Signed)
Pt. Called stating his Advair has gone up since he has a new ins. This year. He wanted to know if you could switch him to [employer] per his new ins. Company that would be cheaper for him.  He uses Paediatric nurse on [address].

## 2021-05-10 ENCOUNTER — Telehealth: Payer: Self-pay

## 2021-05-10 NOTE — Telephone Encounter (Signed)
Called pt to advise the pharmacy had pull the wrong med and they are working on his new script . [name]

## 2021-08-27 IMAGING — DX DG CHEST 1V PORT
1 series · 1 of 1 positions shown · non-contrast
Comparison: None.

CLINICAL DATA: Shortness of breath. History of Y6ZRO-Y3 positive
status

EXAM:
PORTABLE CHEST 1 VIEW

[chest ap]
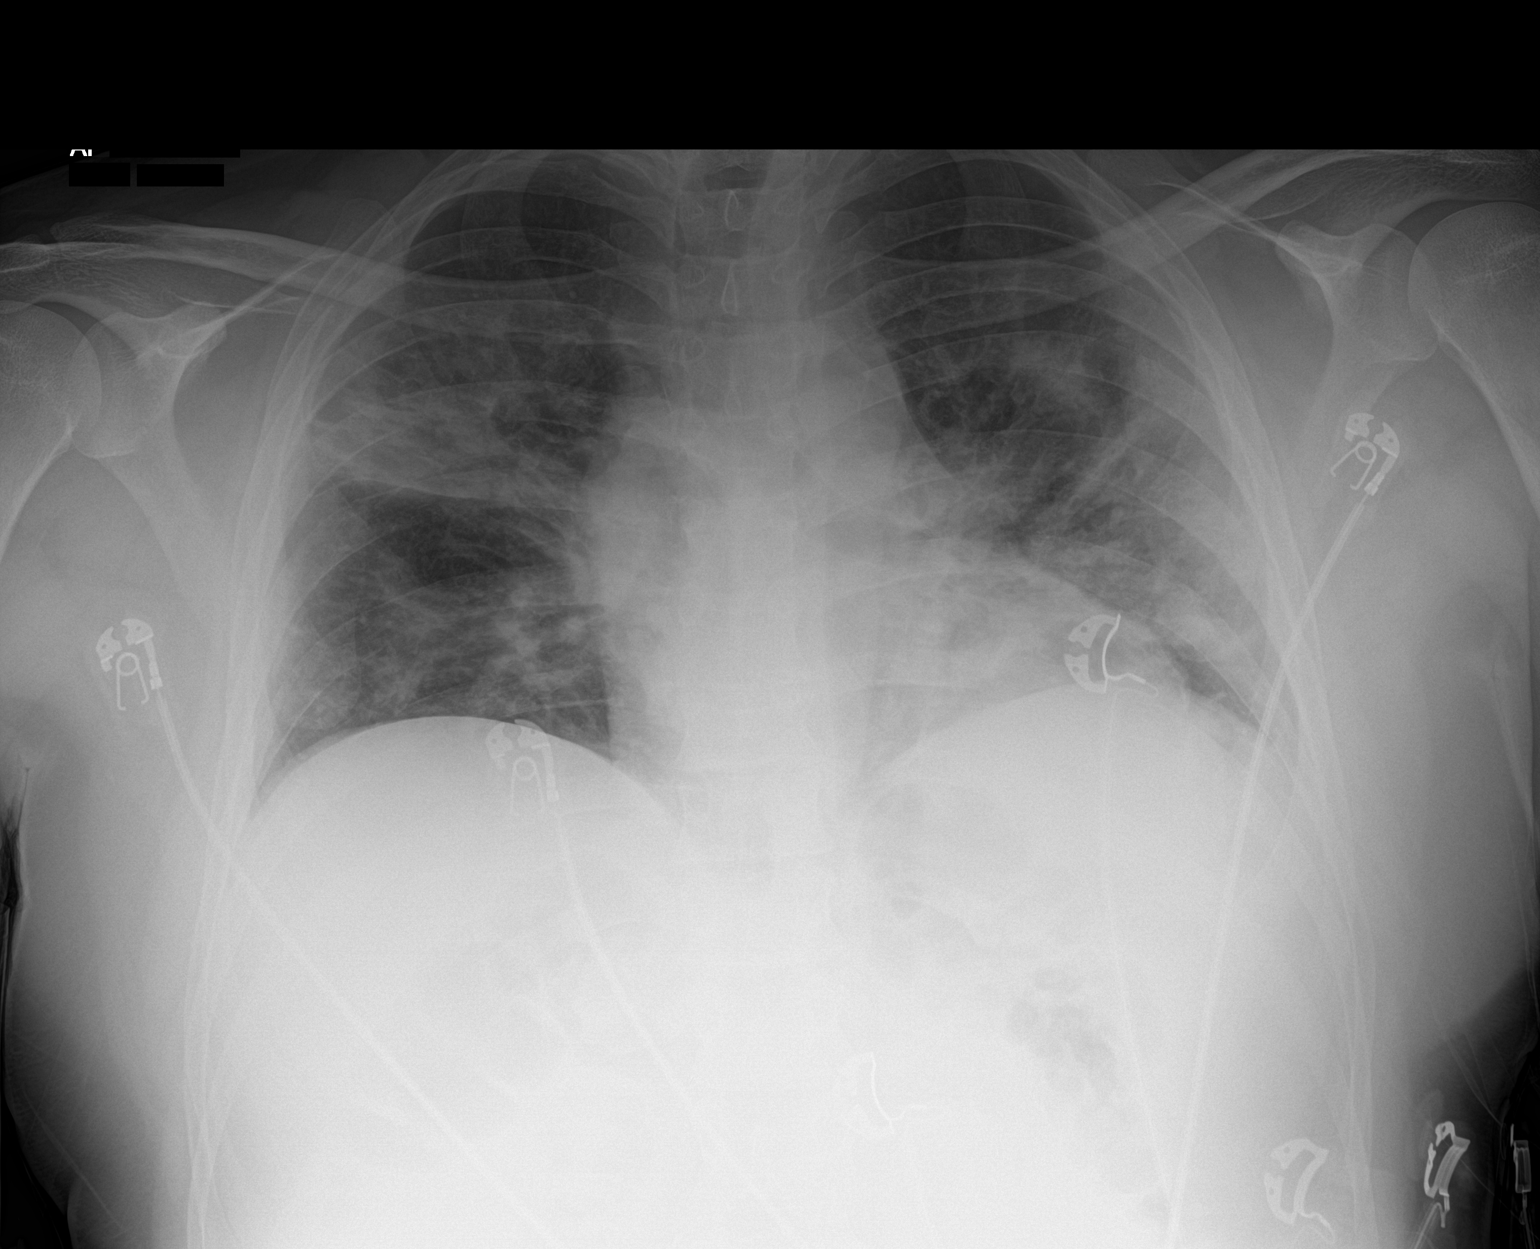

[1 of 1 positions shown; findings below may reference images not displayed]

FINDINGS: There is airspace opacity in each mid and lower lung region. Heart
size and pulmonary vascularity are normal. No adenopathy. No bone
lesions.
IMPRESSION: Multifocal airspace opacity, likely due to atypical organism
pneumonia. Heart size normal. No adenopathy.

## 2021-08-28 IMAGING — CT CT ANGIO CHEST
2 of 6 series · 18 of 46 positions shown · IV contrast (APPLIED)
Comparison: Portable chest 5460 hours today.

CLINICAL DATA: 33-year-old male with shortness of breath. Positive
3ZDFV-E5.

EXAM:
CT ANGIOGRAPHY CHEST WITH CONTRAST
TECHNIQUE: Multidetector CT imaging of the chest was performed using the
standard protocol during bolus administration of intravenous
contrast. Multiplanar CT image reconstructions and MIPs were
obtained to evaluate the vascular anatomy.
CONTRAST:  100mL OMNIPAQUE IOHEXOL 350 MG/ML SOLN

[Series 5: thins · axial · 0.78mm/px · z∈[-267,-57]mm · 16 of 232 slices shown]
[im 11/232  lung]
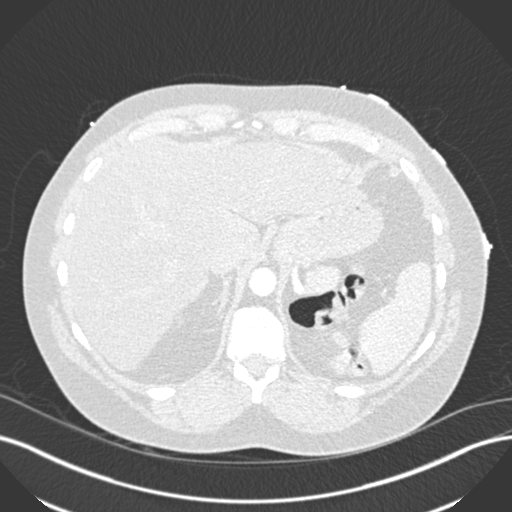
[im 31/232  soft-tissue]
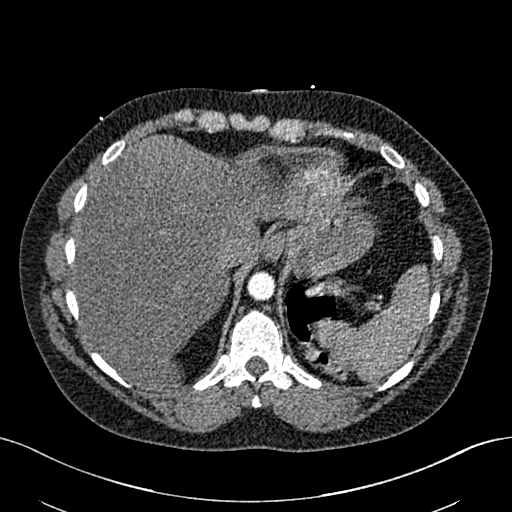
[im 41/232  lung]
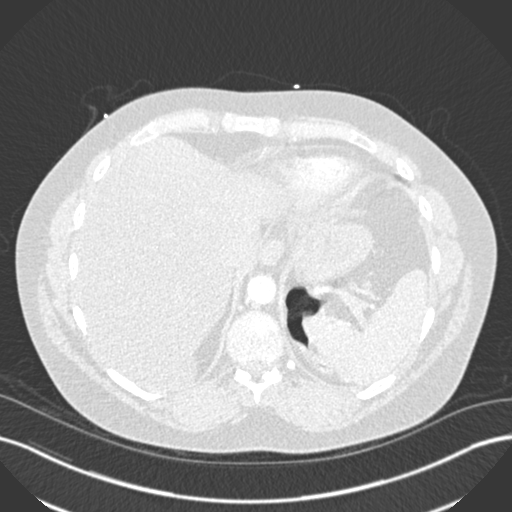
[im 51/232  soft-tissue]
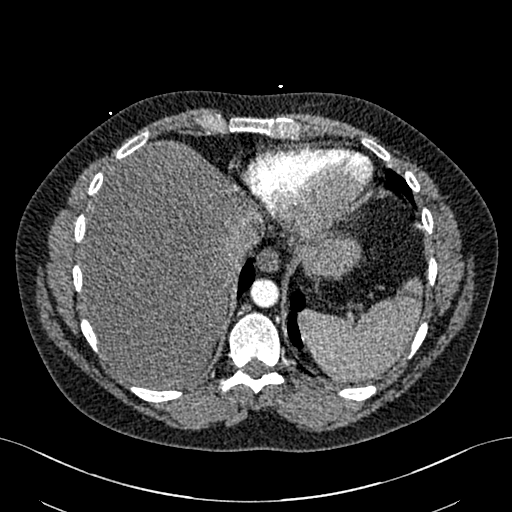
[im 71/232  lung]
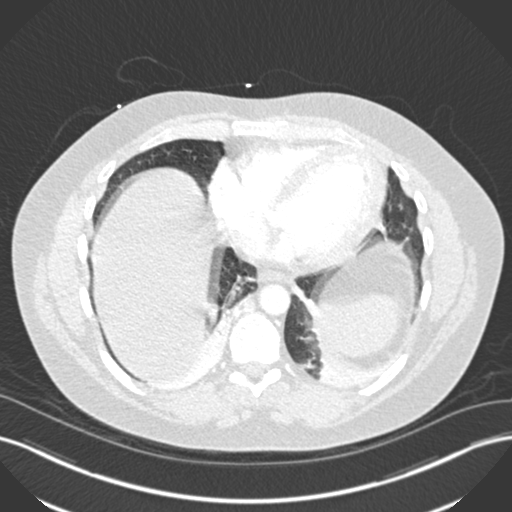
[im 81/232  soft-tissue]
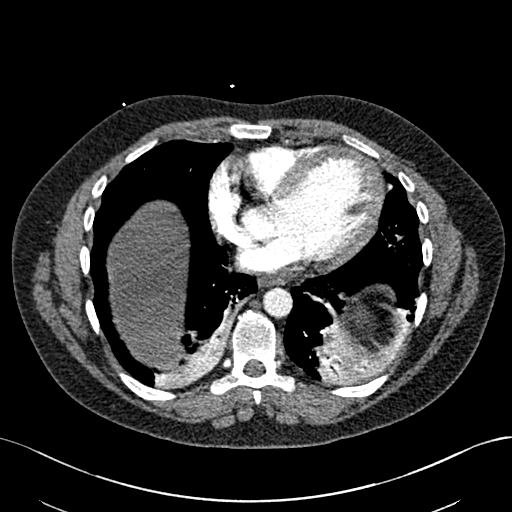
[im 91/232  lung]
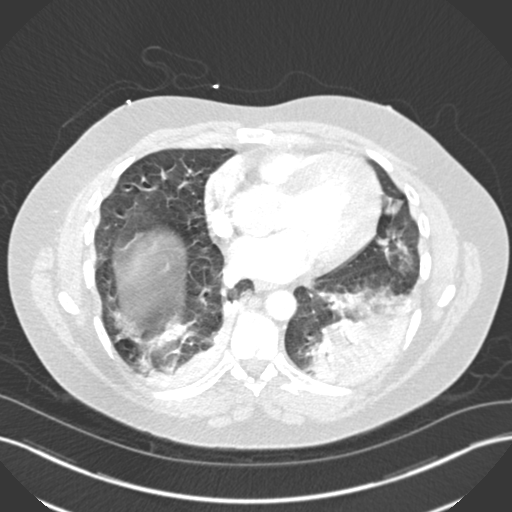
[im 111/232  soft-tissue]
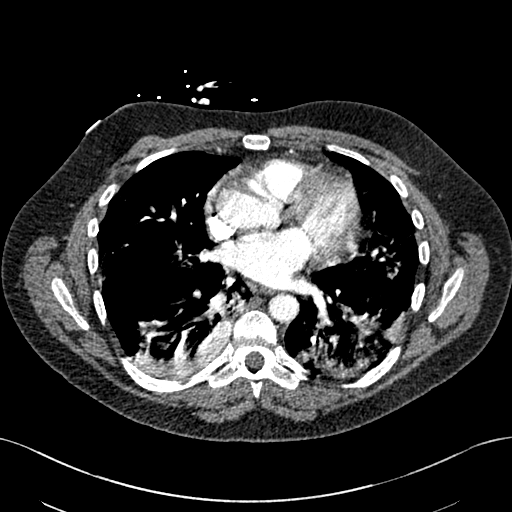
[im 121/232  lung]
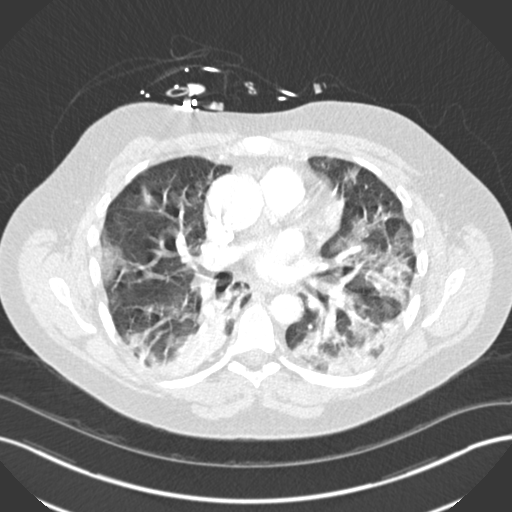
[im 141/232  soft-tissue]
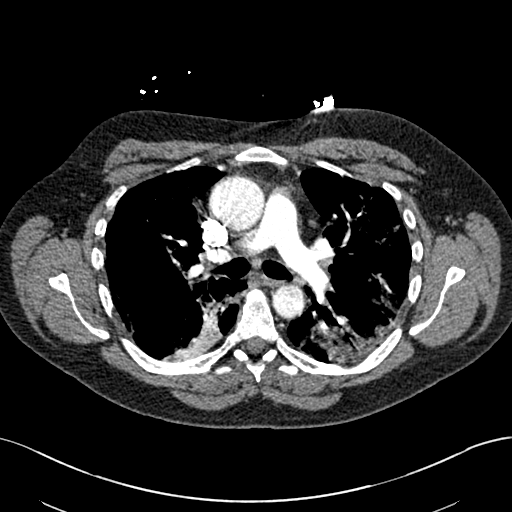
[im 151/232  lung]
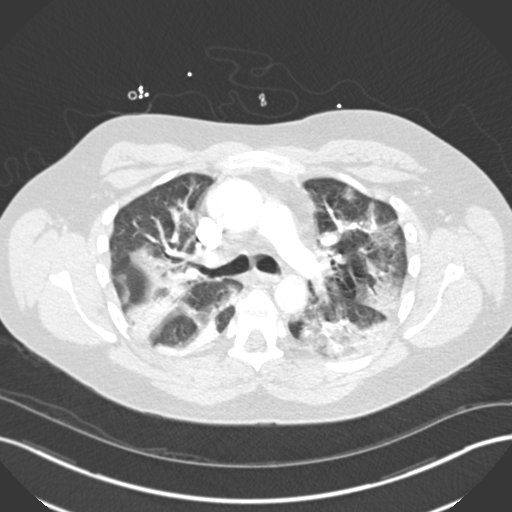
[im 161/232  soft-tissue]
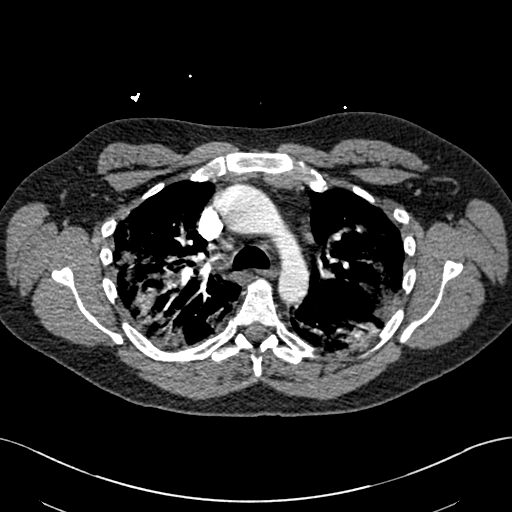
[im 181/232  lung]
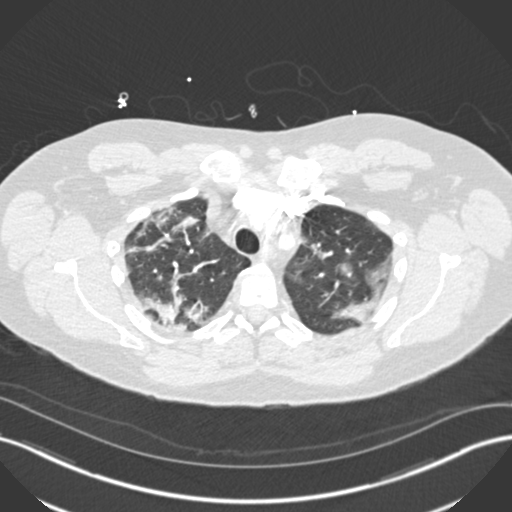
[im 191/232  soft-tissue]
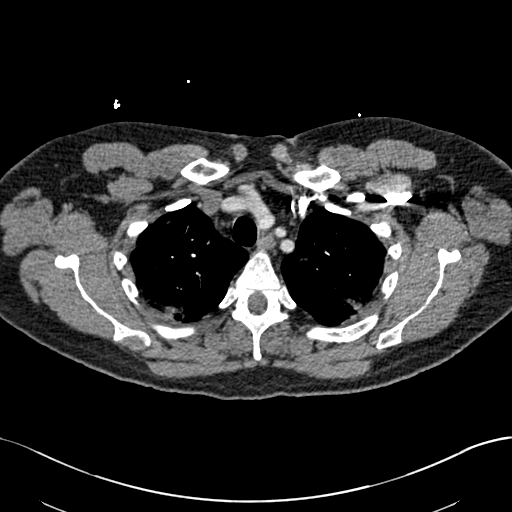
[im 201/232  lung]
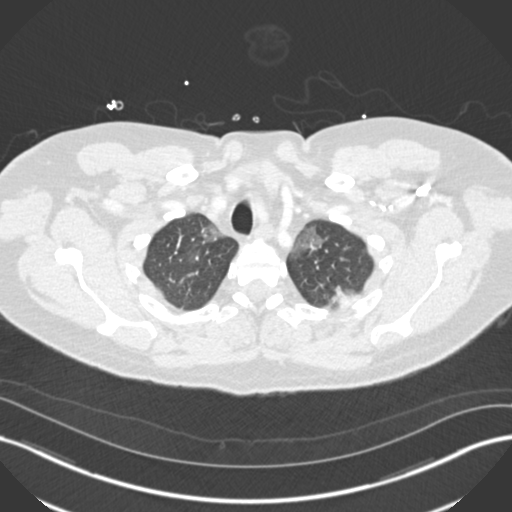
[im 221/232  soft-tissue]
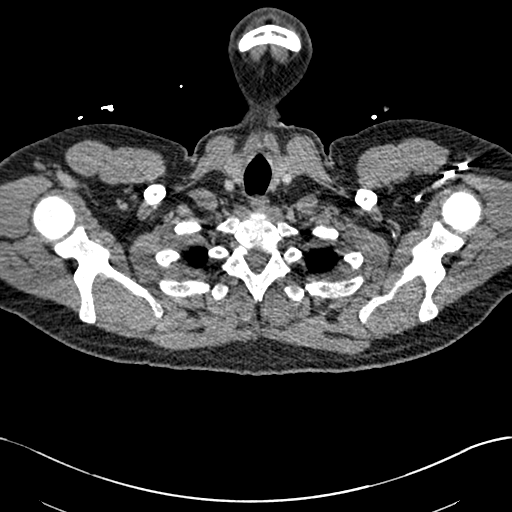

[Series 7: coronal mpr · coronal · 0.46mm/px · 2 of 96 slices shown]
[im 32/96  soft-tissue]
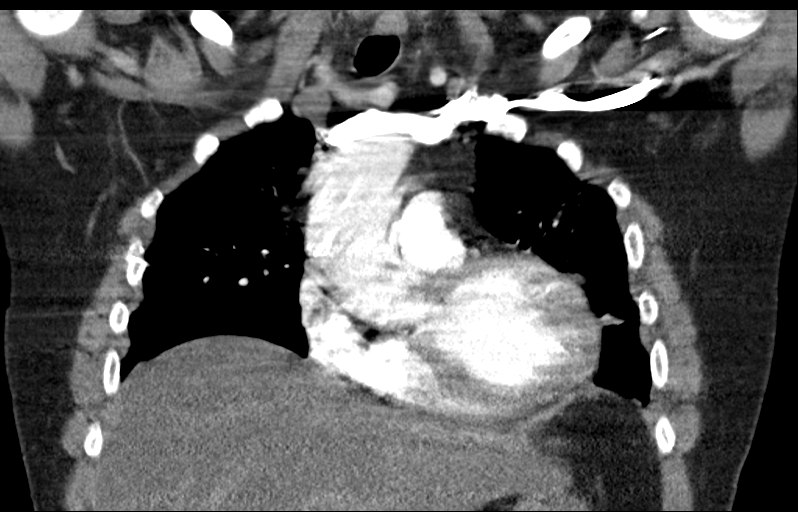
[im 64/96  soft-tissue]
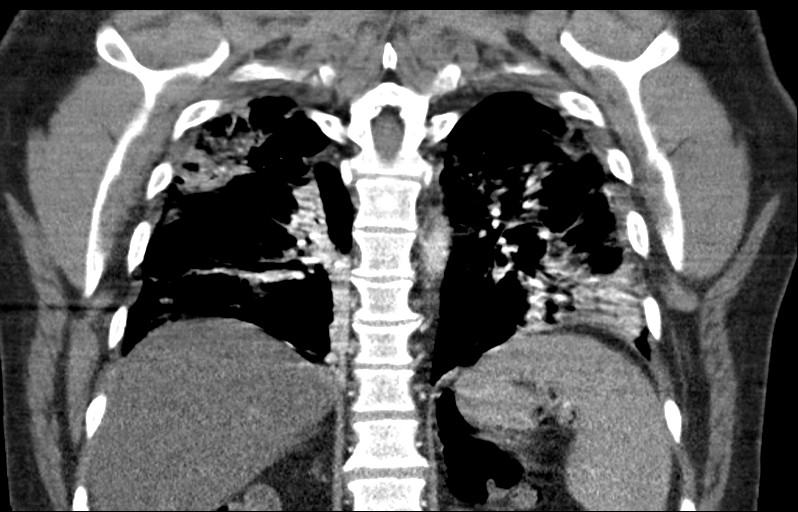

[18 of 46 positions shown; findings below may reference images not displayed]

FINDINGS: Cardiovascular: Adequate contrast bolus timing in the pulmonary
arterial tree. Respiratory motion. No central or hilar pulmonary
embolus. Other pulmonary artery branch detail is degraded.

No cardiomegaly or pericardial effusion. Negative visible aorta.

Mediastinum/Nodes: Mildly reactive appearing bilateral hilar lymph
nodes.

Lungs/Pleura: Left greater than right lower lobe consolidation with
widespread superimposed bilateral upper lobe peribronchial and
peripheral pulmonary ground-glass opacity. Similar involvement in
the lingula. The right middle lobe is relatively spared. No pleural
effusion.

Upper Abdomen: Hepatic steatosis. Negative visible spleen, right
adrenal gland and bowel in the upper abdomen.

Musculoskeletal: No acute osseous abnormality identified.

Review of the MIP images confirms the above findings.
IMPRESSION: 1. No central or hilar pulmonary embolus. Other pulmonary artery
branch detail is degraded by motion.
2. Bilateral 3ZDFV-E5 pneumonia with lower lobe consolidation. No
pleural effusion.
3. Hepatic steatosis.

## 2021-12-15 ENCOUNTER — Encounter: Payer: Self-pay | Admitting: Internal Medicine

## 2022-01-31 ENCOUNTER — Encounter: Payer: Self-pay | Admitting: Internal Medicine

## 2022-05-05 ENCOUNTER — Emergency Department (HOSPITAL_COMMUNITY)
Admission: EM | Admit: 2022-05-05 | Discharge: 2022-05-05 | Disposition: A | Discharge status: Home / Self Care | Payer: 59 | Attending: Emergency Medicine | Admitting: Emergency Medicine

## 2022-05-05 ENCOUNTER — Telehealth: Payer: Self-pay | Admitting: Family Medicine

## 2022-05-05 ENCOUNTER — Encounter (HOSPITAL_COMMUNITY): Payer: Self-pay

## 2022-05-05 DIAGNOSIS — S01312A Laceration without foreign body of left ear, initial encounter: Secondary | ICD-10-CM | POA: Insufficient documentation

## 2022-05-05 DIAGNOSIS — S01312D Laceration without foreign body of left ear, subsequent encounter: Secondary | ICD-10-CM

## 2022-05-05 DIAGNOSIS — Z91010 Allergy to peanuts: Secondary | ICD-10-CM | POA: Diagnosis not present

## 2022-05-05 DIAGNOSIS — X58XXXA Exposure to other specified factors, initial encounter: Secondary | ICD-10-CM | POA: Diagnosis not present

## 2022-05-05 DIAGNOSIS — S09302A Unspecified injury of left middle and inner ear, initial encounter: Secondary | ICD-10-CM | POA: Diagnosis present

## 2022-05-05 MED ORDER — LIDOCAINE-EPINEPHRINE (PF) 2 %-1:200000 IJ SOLN
10.0000 mL | Freq: Once | INTRAMUSCULAR | Status: AC
  Administered 2022-05-05: 10 mL
  Filled 2022-05-05: qty 20

## 2022-05-05 MED ORDER — ONDANSETRON 4 MG PO TBDP
8.0000 mg | ORAL_TABLET | Freq: Once | ORAL | Status: AC
  Administered 2022-05-05: 8 mg via ORAL
  Filled 2022-05-05: qty 2

## 2022-05-05 MED ORDER — CEPHALEXIN 500 MG PO CAPS: 500.0000 mg | ORAL_CAPSULE | Freq: Four times a day (QID) | ORAL | 0 refills | Status: DC

## 2022-05-05 MED ORDER — OXYCODONE-ACETAMINOPHEN 5-325 MG PO TABS
1.0000 | ORAL_TABLET | Freq: Once | ORAL | Status: AC
  Administered 2022-05-05: 1 via ORAL
  Filled 2022-05-05: qty 1
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
## 2022-05-05 NOTE — Discharge Instructions (Addendum)
Take keflex 1000 mg twice daily for 5 days. Follow up with ENT if needed. Sutures will dissolve in 5 days.
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
## 2022-05-05 NOTE — Telephone Encounter (Signed)
Transition Care Management Unsuccessful Follow-up Telephone Call  Date of discharge and from where:  05/05/2022 [facility] ER  Attempts:  1st Attempt  Reason for unsuccessful TCM follow-up call:  Left voice message

## 2022-05-06 ENCOUNTER — Encounter: Payer: Self-pay | Admitting: Family Medicine

## 2022-05-06 ENCOUNTER — Telehealth: Payer: Self-pay | Admitting: Family Medicine

## 2022-05-06 NOTE — Telephone Encounter (Signed)
Transition Care Management Unsuccessful Follow-up Telephone Call  Date of discharge and from where:  05/05/2022 [name]  Attempts:  2nd Attempt  Reason for unsuccessful TCM follow-up call:  Left voice message  Mailing letter

## 2022-11-25 ENCOUNTER — Ambulatory Visit: Payer: 59 | Admitting: Medical

## 2022-11-25 VITALS — BP 130/80 | HR 86 | Wt 176.8 lb

## 2022-11-25 DIAGNOSIS — Z113 Encounter for screening for infections with a predominantly sexual mode of transmission: Secondary | ICD-10-CM | POA: Diagnosis not present

## 2022-11-25 DIAGNOSIS — J4540 Moderate persistent asthma, uncomplicated: Secondary | ICD-10-CM | POA: Diagnosis not present

## 2022-11-25 MED ORDER — BUDESONIDE-FORMOTEROL FUMARATE 160-4.5 MCG/ACT IN AERO: 2.0000 | INHALATION_SPRAY | Freq: Two times a day (BID) | RESPIRATORY_TRACT | 2 refills | Status: DC
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
## 2022-11-26 LAB — HIV ANTIBODY (ROUTINE TESTING W REFLEX): HIV Screen 4th Generation wRfx: NONREACTIVE

## 2022-11-26 LAB — HEPATITIS B SURFACE ANTIGEN: Hepatitis B Surface Ag: NEGATIVE

## 2022-11-26 LAB — HSV 1 ANTIBODY, IGG: HSV 1 Glycoprotein G Ab, IgG: <0.91 {index} (ref 0.00–0.90)

## 2022-11-26 LAB — RPR: RPR Ser Ql: NONREACTIVE

## 2022-11-26 LAB — HEPATITIS C ANTIBODY: Hep C Virus Ab: NONREACTIVE

## 2022-11-28 NOTE — Progress Notes (Signed)
See if lab can add HSV-2 IgG.  I was thinking we had ordered the combo 1 and 2 test but I just got an HSV-1 result  Results sent through MyChart

## 2022-11-29 ENCOUNTER — Telehealth: Payer: Self-pay | Admitting: Family Medicine

## 2022-11-29 LAB — CHLAMYDIA/GONOCOCCUS/TRICHOMONAS, NAA
Chlamydia by NAA: NEGATIVE
Gonococcus by NAA: NEGATIVE
Trich vag by NAA: NEGATIVE

## 2022-11-29 LAB — HSV-2 AB, IGG: HSV 2 IgG, Type Spec: 4.92 {index} — ABNORMAL HIGH (ref 0.00–0.90)

## 2022-11-29 LAB — SPECIMEN STATUS REPORT

## 2022-11-29 NOTE — Telephone Encounter (Signed)
Left message for pt to call me back 

## 2022-11-29 NOTE — Progress Notes (Signed)
Results sent through MyChart

## 2022-11-29 NOTE — Telephone Encounter (Signed)
Pt asks if a nurse can call him about recent labs.

## 2022-11-29 NOTE — Telephone Encounter (Signed)
Patient called and states that he has had areas of bumps come up before in the genital area but with him be active he just thought it was just friction causing this. Pt's last outbreak was a couple months ago. Wife has had bumps pop up too but never thought anything of it before. He states he does have 2 kids and could it be passed down to them? I advised if she had an outbreak during birthing them this possibility but really couldn't tell unless they seen bumps in that area or was tested. Advised pt to call when he had an [place]

## 2022-11-30 NOTE — Telephone Encounter (Signed)
Left message for pt to call me back 

## 2022-11-30 NOTE — Telephone Encounter (Signed)
Pt was notified of results

## 2022-12-03 ENCOUNTER — Telehealth: Payer: Self-pay | Admitting: Family Medicine

## 2022-12-03 NOTE — Telephone Encounter (Signed)
(  [employer] [place]) PA Case ID #: [id number] Rx #: [id number] Need Help? Call us at [phone] Status sent iconSent to Plan today Drug Breyna 160-4.5MCG/ACT aerosol ePA cloud logo Form [employer] Electronic Prior Authorization Form

## 2022-12-05 ENCOUNTER — Encounter: Payer: Self-pay | Admitting: Family Medicine

## 2022-12-05 ENCOUNTER — Ambulatory Visit: Payer: 59 | Admitting: Family Medicine

## 2022-12-05 VITALS — BP 118/70 | HR 74 | Ht 68.0 in | Wt 177.8 lb

## 2022-12-05 DIAGNOSIS — G4733 Obstructive sleep apnea (adult) (pediatric): Secondary | ICD-10-CM

## 2022-12-05 DIAGNOSIS — Z0000 Encounter for general adult medical examination without abnormal findings: Secondary | ICD-10-CM | POA: Diagnosis not present

## 2022-12-05 DIAGNOSIS — J4530 Mild persistent asthma, uncomplicated: Secondary | ICD-10-CM | POA: Diagnosis not present

## 2022-12-05 DIAGNOSIS — J301 Allergic rhinitis due to pollen: Secondary | ICD-10-CM

## 2022-12-05 DIAGNOSIS — Z23 Encounter for immunization: Secondary | ICD-10-CM

## 2022-12-05 DIAGNOSIS — F1010 Alcohol abuse, uncomplicated: Secondary | ICD-10-CM

## 2022-12-05 MED ORDER — ALBUTEROL SULFATE HFA 108 (90 BASE) MCG/ACT IN AERS: 2.0000 | INHALATION_SPRAY | Freq: Four times a day (QID) | RESPIRATORY_TRACT | 0 refills | Status: AC | PRN

## 2022-12-05 MED ORDER — BUDESONIDE-FORMOTEROL FUMARATE 160-4.5 MCG/ACT IN AERO: 2.0000 | INHALATION_SPRAY | Freq: Two times a day (BID) | RESPIRATORY_TRACT | 2 refills | Status: AC

## 2022-12-05 NOTE — Telephone Encounter (Signed)
P.A. denied, brand Symbicort is preferred
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
## 2022-12-06 LAB — COMPREHENSIVE METABOLIC PANEL
ALT: 21 IU/L (ref 0–44)
AST: 15 IU/L (ref 0–40)
Albumin: 4.7 g/dL (ref 4.1–5.1)
Alkaline Phosphatase: 87 IU/L (ref 44–121)
BUN/Creatinine Ratio: 9 (ref 9–20)
BUN: 9 mg/dL (ref 6–20)
Bilirubin Total: 0.6 mg/dL (ref 0.0–1.2)
CO2: 23 mmol/L (ref 20–29)
Calcium: 9.8 mg/dL (ref 8.7–10.2)
Chloride: 102 mmol/L (ref 96–106)
Creatinine, Ser: 0.99 mg/dL (ref 0.76–1.27)
Globulin, Total: 2.2 g/dL (ref 1.5–4.5)
Glucose: 94 mg/dL (ref 70–99)
Potassium: 4.3 mmol/L (ref 3.5–5.2)
Sodium: 140 mmol/L (ref 134–144)
Total Protein: 6.9 g/dL (ref 6.0–8.5)
eGFR: 101 mL/min/{1.73_m2} (ref >59)

## 2022-12-06 LAB — CBC WITH DIFFERENTIAL/PLATELET
Basophils Absolute: 0.1 10*3/uL (ref 0.0–0.2)
Basos: 2 %
EOS (ABSOLUTE): 0.3 10*3/uL (ref 0.0–0.4)
Eos: 5 %
Hematocrit: 44.9 % (ref 37.5–51.0)
Hemoglobin: 15 g/dL (ref 13.0–17.7)
Immature Grans (Abs): 0 10*3/uL (ref 0.0–0.1)
Immature Granulocytes: 0 %
Lymphocytes Absolute: 1.6 10*3/uL (ref 0.7–3.1)
Lymphs: 25 %
MCH: 30.4 pg (ref 26.6–33.0)
MCHC: 33.4 g/dL (ref 31.5–35.7)
MCV: 91 fL (ref 79–97)
Monocytes Absolute: 0.5 10*3/uL (ref 0.1–0.9)
Monocytes: 8 %
Neutrophils Absolute: 3.9 10*3/uL (ref 1.4–7.0)
Neutrophils: 60 %
Platelets: 284 10*3/uL (ref 150–450)
RBC: 4.94 x10E6/uL (ref 4.14–5.80)
RDW: 11.7 % (ref 11.6–15.4)
WBC: 6.4 10*3/uL (ref 3.4–10.8)

## 2022-12-06 LAB — LIPID PANEL
Chol/HDL Ratio: 3.8 ratio (ref 0.0–5.0)
Cholesterol, Total: 145 mg/dL (ref 100–199)
HDL: 38 mg/dL — ABNORMAL LOW (ref >39)
LDL Chol Calc (NIH): 87 mg/dL (ref 0–99)
Triglycerides: 110 mg/dL (ref 0–149)
VLDL Cholesterol Cal: 20 mg/dL (ref 5–40)

## 2022-12-06 NOTE — Telephone Encounter (Signed)
Called pharmacy had to leave message to run as brand Symbicort

## 2022-12-20 NOTE — Telephone Encounter (Signed)
Left another message at pharmacy

## 2023-02-14 ENCOUNTER — Encounter: Payer: 59 | Admitting: Family Medicine

## 2023-06-06 ENCOUNTER — Encounter: Payer: Self-pay | Admitting: Internal Medicine

## 2023-11-24 ENCOUNTER — Encounter: Payer: Self-pay | Admitting: Family Medicine

## 2024-01-03 ENCOUNTER — Encounter: Payer: 59 | Admitting: Family Medicine
